# Patient Record
Sex: Female | Born: 1975 | Race: White | Hispanic: No | Marital: Married | Smoking: Never smoker
Health system: Southern US, Community
[De-identification: ages and names within clinical notes are randomized; demographics above are authoritative.]

## PROBLEM LIST (undated history)

## (undated) DIAGNOSIS — Z9049 Acquired absence of other specified parts of digestive tract: Secondary | ICD-10-CM

## (undated) DIAGNOSIS — N979 Female infertility, unspecified: Secondary | ICD-10-CM

## (undated) DIAGNOSIS — R51 Headache: Secondary | ICD-10-CM

## (undated) DIAGNOSIS — O09529 Supervision of elderly multigravida, unspecified trimester: Secondary | ICD-10-CM

## (undated) DIAGNOSIS — M549 Dorsalgia, unspecified: Secondary | ICD-10-CM

## (undated) DIAGNOSIS — Z8632 Personal history of gestational diabetes: Secondary | ICD-10-CM

## (undated) DIAGNOSIS — T7840XA Allergy, unspecified, initial encounter: Secondary | ICD-10-CM

## (undated) DIAGNOSIS — E739 Lactose intolerance, unspecified: Secondary | ICD-10-CM

## (undated) DIAGNOSIS — E559 Vitamin D deficiency, unspecified: Secondary | ICD-10-CM

## (undated) DIAGNOSIS — N83209 Unspecified ovarian cyst, unspecified side: Secondary | ICD-10-CM

## (undated) DIAGNOSIS — E7800 Pure hypercholesterolemia, unspecified: Secondary | ICD-10-CM

## (undated) DIAGNOSIS — D649 Anemia, unspecified: Secondary | ICD-10-CM

## (undated) DIAGNOSIS — O24419 Gestational diabetes mellitus in pregnancy, unspecified control: Secondary | ICD-10-CM

## (undated) HISTORY — DX: Anemia, unspecified: D64.9

## (undated) HISTORY — DX: Gestational diabetes mellitus in pregnancy, unspecified control: O24.419

## (undated) HISTORY — DX: Supervision of elderly multigravida, unspecified trimester: O09.529

## (undated) HISTORY — DX: Lactose intolerance, unspecified: E73.9

## (undated) HISTORY — DX: Unspecified ovarian cyst, unspecified side: N83.209

## (undated) HISTORY — DX: Acquired absence of other specified parts of digestive tract: Z90.49

## (undated) HISTORY — DX: Pure hypercholesterolemia, unspecified: E78.00

## (undated) HISTORY — DX: Female infertility, unspecified: N97.9

## (undated) HISTORY — DX: Allergy, unspecified, initial encounter: T78.40XA

## (undated) HISTORY — DX: Dorsalgia, unspecified: M54.9

## (undated) HISTORY — PX: CHOLECYSTECTOMY: SHX55

## (undated) HISTORY — DX: Personal history of gestational diabetes: Z86.32

## (undated) HISTORY — PX: TONSILLECTOMY: SUR1361

## (undated) HISTORY — DX: Vitamin D deficiency, unspecified: E55.9

## (undated) HISTORY — DX: Headache: R51

---

## 2012-10-11 LAB — OB RESULTS CONSOLE GBS: GBS: POSITIVE

## 2013-03-30 LAB — OB RESULTS CONSOLE ABO/RH: RH Type: POSITIVE

## 2013-03-30 LAB — OB RESULTS CONSOLE GC/CHLAMYDIA
CHLAMYDIA, DNA PROBE: NEGATIVE
GC PROBE AMP, GENITAL: NEGATIVE

## 2013-03-30 LAB — OB RESULTS CONSOLE RPR: RPR: NONREACTIVE

## 2013-03-30 LAB — OB RESULTS CONSOLE HEPATITIS B SURFACE ANTIGEN: HEP B S AG: NEGATIVE

## 2013-03-30 LAB — OB RESULTS CONSOLE RUBELLA ANTIBODY, IGM: RUBELLA: IMMUNE

## 2013-03-30 LAB — OB RESULTS CONSOLE ANTIBODY SCREEN: Antibody Screen: NEGATIVE

## 2013-03-30 LAB — OB RESULTS CONSOLE HIV ANTIBODY (ROUTINE TESTING): HIV: NONREACTIVE

## 2013-04-23 ENCOUNTER — Inpatient Hospital Stay (HOSPITAL_COMMUNITY): Admission: AD | Admit: 2013-04-23 | Payer: Self-pay | Source: Ambulatory Visit | Admitting: Obstetrics and Gynecology

## 2013-08-17 ENCOUNTER — Encounter: Payer: BC Managed Care – PPO | Attending: Obstetrics and Gynecology

## 2013-08-17 VITALS — Ht 66.75 in | Wt 213.9 lb

## 2013-08-17 DIAGNOSIS — Z713 Dietary counseling and surveillance: Secondary | ICD-10-CM | POA: Insufficient documentation

## 2013-08-17 DIAGNOSIS — O99810 Abnormal glucose complicating pregnancy: Secondary | ICD-10-CM | POA: Insufficient documentation

## 2013-08-17 NOTE — Progress Notes (Signed)
  Patient was seen on 08/17/13 for Gestational Diabetes self-management class at the [facility]. The following learning objectives were met by the patient during this course:   States the definition of Gestational Diabetes  States why dietary management is important in controlling blood glucose  Describes the effects of carbohydrates on blood glucose levels  Demonstrates ability to create a balanced meal plan  Demonstrates carbohydrate counting   States when to check blood glucose levels  Demonstrates proper blood glucose monitoring techniques  States the effect of stress and exercise on blood glucose levels  States the importance of limiting caffeine and abstaining from alcohol and smoking  Plan:  Aim for 2 Carb Choices per meal (30 grams) +/- 1 either way for breakfast Aim for 3 Carb Choices per meal (45 grams) +/- 1 either way from lunch and dinner Aim for 1-2 Carbs per snack Begin reading food labels for Total Carbohydrate and sugar grams of foods Consider  increasing your activity level by walking daily as tolerated Begin checking BG before breakfast and 1-2 hours after first bit of breakfast, lunch and dinner after  as directed by MD  Take medication  as directed by MD  Blood glucose monitor given: Accu Chek Nano BG Monitoring Kit Lot # [id number] Exp: 08/25/14 Blood glucose reading: 102  Patient instructed to monitor glucose levels: FBS: 60 - <90 1 hour: <140 2 hour: <120  Patient received the following handouts:  Nutrition Diabetes and Pregnancy  Carbohydrate Counting List  Meal Planning worksheet  Patient will be seen for follow-up as needed.

## 2013-10-11 LAB — OB RESULTS CONSOLE GBS: GBS: POSITIVE

## 2013-10-29 ENCOUNTER — Encounter (HOSPITAL_COMMUNITY): Payer: Self-pay | Admitting: *Deleted

## 2013-10-29 ENCOUNTER — Telehealth (HOSPITAL_COMMUNITY): Payer: Self-pay | Admitting: *Deleted

## 2013-10-29 NOTE — Telephone Encounter (Signed)
Preadmission screen  

## 2013-11-02 ENCOUNTER — Inpatient Hospital Stay (HOSPITAL_COMMUNITY): Payer: BC Managed Care – PPO | Admitting: Anesthesiology

## 2013-11-02 ENCOUNTER — Encounter (HOSPITAL_COMMUNITY): Payer: BC Managed Care – PPO | Admitting: Anesthesiology

## 2013-11-02 ENCOUNTER — Encounter (HOSPITAL_COMMUNITY)
Admission: AD | Disposition: A | Discharge status: Home / Self Care | Payer: Self-pay | Source: Ambulatory Visit | Attending: Obstetrics and Gynecology

## 2013-11-02 ENCOUNTER — Encounter (HOSPITAL_COMMUNITY): Payer: Self-pay | Admitting: *Deleted

## 2013-11-02 ENCOUNTER — Inpatient Hospital Stay (HOSPITAL_COMMUNITY)
Admission: AD | Admit: 2013-11-02 | Discharge: 2013-11-05 | DRG: 766 | Disposition: A | Discharge status: Home / Self Care | Payer: BC Managed Care – PPO | Source: Ambulatory Visit | Attending: Obstetrics and Gynecology | Admitting: Obstetrics and Gynecology

## 2013-11-02 DIAGNOSIS — O99892 Other specified diseases and conditions complicating childbirth: Secondary | ICD-10-CM | POA: Diagnosis present

## 2013-11-02 DIAGNOSIS — E669 Obesity, unspecified: Secondary | ICD-10-CM | POA: Diagnosis present

## 2013-11-02 DIAGNOSIS — O09529 Supervision of elderly multigravida, unspecified trimester: Secondary | ICD-10-CM | POA: Diagnosis present

## 2013-11-02 DIAGNOSIS — O4100X0 Oligohydramnios, unspecified trimester, not applicable or unspecified: Secondary | ICD-10-CM | POA: Diagnosis present

## 2013-11-02 DIAGNOSIS — D252 Subserosal leiomyoma of uterus: Secondary | ICD-10-CM | POA: Diagnosis present

## 2013-11-02 DIAGNOSIS — Z22330 Carrier of Group B streptococcus: Secondary | ICD-10-CM

## 2013-11-02 DIAGNOSIS — O99214 Obesity complicating childbirth: Secondary | ICD-10-CM

## 2013-11-02 DIAGNOSIS — O34599 Maternal care for other abnormalities of gravid uterus, unspecified trimester: Secondary | ICD-10-CM | POA: Diagnosis present

## 2013-11-02 DIAGNOSIS — O99814 Abnormal glucose complicating childbirth: Secondary | ICD-10-CM | POA: Diagnosis present

## 2013-11-02 DIAGNOSIS — Z98891 History of uterine scar from previous surgery: Secondary | ICD-10-CM

## 2013-11-02 DIAGNOSIS — D4959 Neoplasm of unspecified behavior of other genitourinary organ: Secondary | ICD-10-CM | POA: Diagnosis present

## 2013-11-02 DIAGNOSIS — O3410 Maternal care for benign tumor of corpus uteri, unspecified trimester: Secondary | ICD-10-CM

## 2013-11-02 DIAGNOSIS — O9989 Other specified diseases and conditions complicating pregnancy, childbirth and the puerperium: Secondary | ICD-10-CM

## 2013-11-02 LAB — CBC
HCT: 38.2 % (ref 36.0–46.0)
Hemoglobin: 13 g/dL (ref 12.0–15.0)
MCH: 27.8 pg (ref 26.0–34.0)
MCHC: 34 g/dL (ref 30.0–36.0)
MCV: 81.8 fL (ref 78.0–100.0)
PLATELETS: 168 10*3/uL (ref 150–400)
RBC: 4.67 MIL/uL (ref 3.87–5.11)
RDW: 16.1 % — ABNORMAL HIGH (ref 11.5–15.5)
WBC: 7.8 10*3/uL (ref 4.0–10.5)

## 2013-11-02 LAB — GLUCOSE, CAPILLARY
Glucose-Capillary: 83 mg/dL (ref 70–99)
Glucose-Capillary: 83 mg/dL (ref 70–99)

## 2013-11-02 LAB — RPR: RPR Ser Ql: NONREACTIVE

## 2013-11-02 SURGERY — Surgical Case
Anesthesia: Spinal | Site: Abdomen

## 2013-11-02 MED ORDER — FENTANYL CITRATE 0.05 MG/ML IJ SOLN
INTRAMUSCULAR | Status: DC | PRN
  Administered 2013-11-02: 25 ug via INTRATHECAL

## 2013-11-02 MED ORDER — LACTATED RINGERS IV SOLN: INTRAVENOUS | Status: DC

## 2013-11-02 MED ORDER — IBUPROFEN 600 MG PO TABS: 600.0000 mg | ORAL_TABLET | Freq: Four times a day (QID) | ORAL | Status: DC | PRN

## 2013-11-02 MED ORDER — ONDANSETRON HCL 4 MG/2ML IJ SOLN
INTRAMUSCULAR | Status: DC | PRN
  Administered 2013-11-02: 4 mg via INTRAVENOUS

## 2013-11-02 MED ORDER — FLEET ENEMA 7-19 GM/118ML RE ENEM: 1.0000 | ENEMA | RECTAL | Status: DC | PRN

## 2013-11-02 MED ORDER — MEPERIDINE HCL 25 MG/ML IJ SOLN: 6.2500 mg | INTRAMUSCULAR | Status: DC | PRN

## 2013-11-02 MED ORDER — OXYTOCIN 40 UNITS IN LACTATED RINGERS INFUSION - SIMPLE MED: 62.5000 mL/h | INTRAVENOUS | Status: DC

## 2013-11-02 MED ORDER — CEFAZOLIN SODIUM-DEXTROSE 2-3 GM-% IV SOLR
2.0000 g | Freq: Once | INTRAVENOUS | Status: AC
  Administered 2013-11-02: 2 g via INTRAVENOUS
  Filled 2013-11-02: qty 50

## 2013-11-02 MED ORDER — KETOROLAC TROMETHAMINE 30 MG/ML IJ SOLN
30.0000 mg | Freq: Four times a day (QID) | INTRAMUSCULAR | Status: AC | PRN
  Administered 2013-11-02: 30 mg via INTRAVENOUS

## 2013-11-02 MED ORDER — LIDOCAINE HCL (PF) 1 % IJ SOLN: 30.0000 mL | INTRAMUSCULAR | Status: DC | PRN

## 2013-11-02 MED ORDER — PHENYLEPHRINE 8 MG IN D5W 100 ML (0.08MG/ML) PREMIX OPTIME
INJECTION | INTRAVENOUS | Status: DC | PRN
  Administered 2013-11-02: 60 ug/min via INTRAVENOUS

## 2013-11-02 MED ORDER — MISOPROSTOL 25 MCG QUARTER TABLET
25.0000 ug | ORAL_TABLET | ORAL | Status: DC
  Administered 2013-11-02: 25 ug via VAGINAL
  Filled 2013-11-02: qty 0.25
  Filled 2013-11-02 (×3): qty 1

## 2013-11-02 MED ORDER — MEPERIDINE HCL 25 MG/ML IJ SOLN
6.2500 mg | INTRAMUSCULAR | Status: DC | PRN
Start: 2013-11-02 — End: 2013-11-03

## 2013-11-02 MED ORDER — ONDANSETRON HCL 4 MG/2ML IJ SOLN
INTRAMUSCULAR | Status: AC
  Filled 2013-11-02: qty 2

## 2013-11-02 MED ORDER — BUPIVACAINE IN DEXTROSE 0.75-8.25 % IT SOLN
INTRATHECAL | Status: DC | PRN
  Administered 2013-11-02: 1.6 mL via INTRATHECAL

## 2013-11-02 MED ORDER — FENTANYL CITRATE 0.05 MG/ML IJ SOLN
INTRAMUSCULAR | Status: AC
  Filled 2013-11-02: qty 2

## 2013-11-02 MED ORDER — OXYTOCIN 10 UNIT/ML IJ SOLN
40.0000 [IU] | INTRAVENOUS | Status: DC | PRN
  Administered 2013-11-02: 40 [IU] via INTRAVENOUS

## 2013-11-02 MED ORDER — LACTATED RINGERS IV SOLN
INTRAVENOUS | Status: DC | PRN
  Administered 2013-11-02: 20:00:00 via INTRAVENOUS

## 2013-11-02 MED ORDER — DEXTROSE 5 % IV SOLN
2.5000 10*6.[IU] | INTRAVENOUS | Status: DC
  Filled 2013-11-02 (×4): qty 2.5

## 2013-11-02 MED ORDER — MORPHINE SULFATE (PF) 0.5 MG/ML IJ SOLN
INTRAMUSCULAR | Status: DC | PRN
  Administered 2013-11-02: .15 mg via INTRATHECAL

## 2013-11-02 MED ORDER — ACETAMINOPHEN 160 MG/5ML PO SOLN
325.0000 mg | ORAL | Status: DC | PRN
Start: 2013-11-02 — End: 2013-11-03

## 2013-11-02 MED ORDER — PROMETHAZINE HCL 25 MG/ML IJ SOLN: 6.2500 mg | INTRAMUSCULAR | Status: DC | PRN

## 2013-11-02 MED ORDER — LACTATED RINGERS IV SOLN
INTRAVENOUS | Status: DC | PRN
  Administered 2013-11-02 (×2): via INTRAVENOUS

## 2013-11-02 MED ORDER — ONDANSETRON HCL 4 MG/2ML IJ SOLN: 4.0000 mg | Freq: Four times a day (QID) | INTRAMUSCULAR | Status: DC | PRN

## 2013-11-02 MED ORDER — KETOROLAC TROMETHAMINE 30 MG/ML IJ SOLN
INTRAMUSCULAR | Status: AC
  Filled 2013-11-02: qty 1

## 2013-11-02 MED ORDER — OXYCODONE-ACETAMINOPHEN 5-325 MG PO TABS: 1.0000 | ORAL_TABLET | ORAL | Status: DC | PRN

## 2013-11-02 MED ORDER — KETOROLAC TROMETHAMINE 30 MG/ML IJ SOLN: 30.0000 mg | Freq: Four times a day (QID) | INTRAMUSCULAR | Status: AC | PRN

## 2013-11-02 MED ORDER — ACETAMINOPHEN 325 MG PO TABS: 325.0000 mg | ORAL_TABLET | ORAL | Status: DC | PRN

## 2013-11-02 MED ORDER — LACTATED RINGERS IV SOLN: 500.0000 mL | INTRAVENOUS | Status: DC | PRN

## 2013-11-02 MED ORDER — SCOPOLAMINE 1 MG/3DAYS TD PT72
MEDICATED_PATCH | TRANSDERMAL | Status: AC
  Filled 2013-11-02: qty 1

## 2013-11-02 MED ORDER — SCOPOLAMINE 1 MG/3DAYS TD PT72
1.0000 | MEDICATED_PATCH | Freq: Once | TRANSDERMAL | Status: DC
  Administered 2013-11-02: 1.5 mg via TRANSDERMAL

## 2013-11-02 MED ORDER — OXYTOCIN BOLUS FROM INFUSION: 500.0000 mL | INTRAVENOUS | Status: DC

## 2013-11-02 MED ORDER — PHENYLEPHRINE 8 MG IN D5W 100 ML (0.08MG/ML) PREMIX OPTIME
INJECTION | INTRAVENOUS | Status: AC
  Filled 2013-11-02: qty 100

## 2013-11-02 MED ORDER — PENICILLIN G POTASSIUM 5000000 UNITS IJ SOLR
5.0000 10*6.[IU] | Freq: Once | INTRAMUSCULAR | Status: DC
  Filled 2013-11-02: qty 5

## 2013-11-02 MED ORDER — FENTANYL CITRATE 0.05 MG/ML IJ SOLN: 25.0000 ug | INTRAMUSCULAR | Status: DC | PRN

## 2013-11-02 MED ORDER — MORPHINE SULFATE 0.5 MG/ML IJ SOLN
INTRAMUSCULAR | Status: AC
  Filled 2013-11-02: qty 10

## 2013-11-02 MED ORDER — OXYTOCIN 10 UNIT/ML IJ SOLN
INTRAMUSCULAR | Status: AC
  Filled 2013-11-02: qty 4

## 2013-11-02 MED ORDER — CITRIC ACID-SODIUM CITRATE 334-500 MG/5ML PO SOLN
30.0000 mL | ORAL | Status: DC | PRN
  Administered 2013-11-02: 30 mL via ORAL
  Filled 2013-11-02: qty 15

## 2013-11-02 MED ORDER — ACETAMINOPHEN 325 MG PO TABS: 650.0000 mg | ORAL_TABLET | ORAL | Status: DC | PRN

## 2013-11-02 SURGICAL SUPPLY — 32 items
CLAMP CORD UMBIL (MISCELLANEOUS) IMPLANT
CLOTH BEACON ORANGE TIMEOUT ST (SAFETY) ×2 IMPLANT
DERMABOND ADHESIVE PROPEN (GAUZE/BANDAGES/DRESSINGS) ×1
DERMABOND ADVANCED (GAUZE/BANDAGES/DRESSINGS) ×1
DERMABOND ADVANCED .7 DNX12 (GAUZE/BANDAGES/DRESSINGS) ×1 IMPLANT
DERMABOND ADVANCED .7 DNX6 (GAUZE/BANDAGES/DRESSINGS) ×1 IMPLANT
DRAPE LG THREE QUARTER DISP (DRAPES) IMPLANT
DRSG OPSITE POSTOP 4X10 (GAUZE/BANDAGES/DRESSINGS) ×2 IMPLANT
DURAPREP 26ML APPLICATOR (WOUND CARE) ×2 IMPLANT
ELECT REM PT RETURN 9FT ADLT (ELECTROSURGICAL) ×2
ELECTRODE REM PT RTRN 9FT ADLT (ELECTROSURGICAL) ×1 IMPLANT
EXTRACTOR VACUUM M CUP 4 TUBE (SUCTIONS) IMPLANT
GLOVE BIO SURGEON STRL SZ7 (GLOVE) ×2 IMPLANT
GOWN STRL REUS W/ TWL XL LVL3 (GOWN DISPOSABLE) ×1 IMPLANT
GOWN STRL REUS W/TWL LRG LVL3 (GOWN DISPOSABLE) ×2 IMPLANT
GOWN STRL REUS W/TWL XL LVL3 (GOWN DISPOSABLE) ×1
KIT ABG SYR 3ML LUER SLIP (SYRINGE) ×2 IMPLANT
NEEDLE HYPO 25X5/8 SAFETYGLIDE (NEEDLE) ×2 IMPLANT
NS IRRIG 1000ML POUR BTL (IV SOLUTION) ×2 IMPLANT
PACK C SECTION WH (CUSTOM PROCEDURE TRAY) ×2 IMPLANT
PAD OB MATERNITY 4.3X12.25 (PERSONAL CARE ITEMS) ×2 IMPLANT
STAPLER VISISTAT 35W (STAPLE) IMPLANT
SUT CHROMIC 0 CTX 36 (SUTURE) ×4 IMPLANT
SUT MON AB 4-0 PS1 27 (SUTURE) ×2 IMPLANT
SUT PDS AB 0 CT 36 (SUTURE) ×2 IMPLANT
SUT PLAIN 0 NONE (SUTURE) IMPLANT
SUT PLAIN 2 0 XLH (SUTURE) IMPLANT
SUT VIC AB 3-0 CT1 27 (SUTURE) ×1
SUT VIC AB 3-0 CT1 TAPERPNT 27 (SUTURE) ×1 IMPLANT
TOWEL OR 17X24 6PK STRL BLUE (TOWEL DISPOSABLE) ×2 IMPLANT
TRAY FOLEY CATH 14FR (SET/KITS/TRAYS/PACK) ×2 IMPLANT
WATER STERILE IRR 1000ML POUR (IV SOLUTION) IMPLANT

## 2013-11-02 NOTE — Progress Notes (Signed)
Dr. Radene Knee into see pt prior to surgery to provide informed consent.

## 2013-11-02 NOTE — Progress Notes (Signed)
Patient ID: Mary Rocha, female   DOB: [birth year], 38 y.o.   MRN: [id number] Noted on sonogram today efw 8'  15''.  Discussed risk of macrosomia along with gestational diabetes.  Discussed risk of shoulder dystocia and resutltant complications.   fraxture to humerus or collar bone.  Brachial plexus injury with paralysis of arm.  Even fetal death.  Answered all questions.  Offered primary cesarean section.  They will discuss it.

## 2013-11-02 NOTE — Op Note (Signed)
Patient name  Mary Rocha, Mary Rocha DICTATION#  [id number] CSN# [id number]  Darlyn Chamber, MD 11/02/2013 8:32 PM

## 2013-11-02 NOTE — Brief Op Note (Signed)
11/02/2013  8:32 PM  PATIENT:  Mary Rocha  38 y.o. female  PRE-OPERATIVE DIAGNOSIS:  Failure to Progress  POST-OPERATIVE DIAGNOSIS:  Failure to Progress  PROCEDURE:  Procedure(s): CESAREAN SECTION (N/A)  SURGEON:  Surgeon(s) and Role:    * Darlyn Chamber, MD - Primary  PHYSICIAN ASSISTANT:   ASSISTANTS: none   ANESTHESIA:   spinal  EBL:  Total I/O In: 2300 [I.V.:2300] Out: 700 [Urine:100; Blood:600]  BLOOD ADMINISTERED:none  DRAINS: Urinary Catheter (Foley)   LOCAL MEDICATIONS USED:  NONE  SPECIMEN:  No Specimen  DISPOSITION OF SPECIMEN:  N/A  COUNTS:  YES  TOURNIQUET:  * No tourniquets in log *  DICTATION: .Other Dictation: Dictation Number [id number]  PLAN OF CARE: Admit to inpatient   PATIENT DISPOSITION:  PACU - hemodynamically stable.   Delay start of Pharmacological VTE agent (>24hrs) due to surgical blood loss or risk of bleeding: yes

## 2013-11-02 NOTE — Transfer of Care (Signed)
Immediate Anesthesia Transfer of Care Note  Patient: Mary Rocha  Procedure(s) Performed: Procedure(s): CESAREAN SECTION (N/A)  Patient Location: PACU  Anesthesia Type:Spinal  Level of Consciousness: awake  Airway & Oxygen Therapy: Patient Spontanous Breathing  Post-op Assessment: Report given to PACU RN  Post vital signs: Reviewed and stable  Complications: No apparent anesthesia complications

## 2013-11-02 NOTE — Anesthesia Preprocedure Evaluation (Signed)
Anesthesia Evaluation  Patient identified by MRN, date of birth, ID band Patient awake    Reviewed: Allergy & Precautions, H&P , Patient's Chart, lab work & pertinent test results  Airway Mallampati: III TM Distance: >3 FB Neck ROM: full    Dental no notable dental hx.    Pulmonary  breath sounds clear to auscultation  Pulmonary exam normal       Cardiovascular Exercise Tolerance: Good Rhythm:regular Rate:Normal     Neuro/Psych  Headaches,    GI/Hepatic   Endo/Other  diabetesMorbid obesity  Renal/GU      Musculoskeletal   Abdominal   Peds  Hematology   Anesthesia Other Findings   Reproductive/Obstetrics                           Anesthesia Physical Anesthesia Plan  ASA: III  Anesthesia Plan: Spinal   Post-op Pain Management:    Induction:   Airway Management Planned:   Additional Equipment:   Intra-op Plan:   Post-operative Plan:   Informed Consent: I have reviewed the patients History and Physical, chart, labs and discussed the procedure including the risks, benefits and alternatives for the proposed anesthesia with the patient or authorized representative who has indicated his/her understanding and acceptance.     Plan Discussed with:   Anesthesia Plan Comments:         Anesthesia Quick Evaluation

## 2013-11-02 NOTE — H&P (Signed)
Mary Rocha is a 38 y.o. female presenting at 2 weeks for induction.  Seen in the office today for sonogram which revealed AFI < 3rd%tile.  EFW 8'15''.  PNC complicated by gestational diabetes diet controlled.  Positive GBS. Maternal Medical History:  Reason for admission: Induction for decreased aAFI  Contractions: Frequency: rare.   Perceived severity is mild.    Fetal activity: Perceived fetal activity is normal.    Prenatal complications: Oligohydramnios.  Gestational diabetes diet controlled.  IVF pregnancy.  posotove GBS  Prenatal Complications - Diabetes: gestational. Diabetes is managed by diet.      OB History   Grav Para Term Preterm Abortions TAB SAB Ect Mult Living   2 0   1  1        Past Medical History  Diagnosis Date  . Allergy   . History of cholecystectomy   . Newborn product of IVF pregnancy   . Gestational diabetes   . AMA (advanced maternal age) multigravida 57+   . IONGEXBM(841.3)    Past Surgical History  Procedure Laterality Date  . Tonsillectomy    . Cholecystectomy     Family History: family history includes Cancer in her maternal grandfather and paternal grandfather; Diabetes in her maternal aunt; Heart disease in her paternal grandfather; Thyroid disease in her maternal grandmother. Social History:  has no tobacco, alcohol, and drug history on file.   Prenatal Transfer Tool  Maternal Diabetes: Yes:  Diabetes Type:  Diet controlled Genetic Screening: Declined Maternal Ultrasounds/Referrals: Normal Fetal Ultrasounds or other Referrals:  None Maternal Substance Abuse:  No Significant Maternal Medications:  None Significant Maternal Lab Results:  Lab values include: Group B Strep positive Other Comments:  None  ROS  Dilation: Fingertip Effacement (%): 50 Station: -2 Exam by:: m wilkins rnc Blood pressure 142/83, pulse 77, height 5\' 6"  (1.676 m), weight 111.131 kg (245 lb). Maternal Exam:  Uterine Assessment: Contraction strength  is mild.  Contraction frequency is rare.   Abdomen: Patient reports no abdominal tenderness. Fundal height is c/w dates.   Estimated fetal weight is 8'  15''.   Fetal presentation: vertex  Introitus: Amniotic fluid character: not assessed.  Pelvis: adequate for delivery.   Cervix: Finger tip and 50 % vtx -2 per Dr Helane Rima  Fetal Exam Fetal State Assessment: Category I - tracings are normal.     Physical Exam  Prenatal labs: ABO, Rh: O/Positive/-- (08/05 0000) Antibody: Negative (08/05 0000) Rubella: Immune (08/05 0000) RPR: Nonreactive (08/05 0000)  HBsAg: Negative (08/05 0000)  HIV: Non-reactive (08/05 0000)  GBS: Positive (02/16 0000)   Assessment/Plan: Pregnancy at term with oligohydramnios Gestational diabetes Positive GBS Plan cytotec risks discussed including hyperstimulation.  Antibiotics with labor or ROM   Mary Rocha S 11/02/2013, 2:22 PM

## 2013-11-02 NOTE — Anesthesia Postprocedure Evaluation (Signed)
  Anesthesia Post-op Note  Patient: Mary Rocha  Procedure(s) Performed: Procedure(s): CESAREAN SECTION (N/A)  Patient Location: PACU  Anesthesia Type:Spinal  Level of Consciousness: awake, alert  and oriented  Airway and Oxygen Therapy: Patient Spontanous Breathing  Post-op Pain: none  Post-op Assessment: Post-op Vital signs reviewed, Patient's Cardiovascular Status Stable, Respiratory Function Stable, Patent Airway, No signs of Nausea or vomiting, Pain level controlled, No headache and No backache  Post-op Vital Signs: Reviewed and stable  Complications: No apparent anesthesia complications

## 2013-11-02 NOTE — Progress Notes (Signed)
Patient ID: Mary Rocha, female   DOB: 04-06-1976, 38 y.o.   MRN: [id number] Desires primary cesarean section.  Risk of cesarean section discussed.  These include:  Risk of infection;  Risk of hemorrhage that could require transfusions with the associated risk of aids or hepatitis;  Excessive bleeding could require hysterectomy;  Risk of injury to adjacent organs including bladder, bowel or ureters;  Risk of DVT's and possible pulmonary embolus.  Patient expresses a understanding of indications and risks.;

## 2013-11-02 NOTE — Anesthesia Procedure Notes (Signed)
Spinal  Patient location during procedure: OR Start time: 11/02/2013 7:44 PM Staffing Anesthesiologist: Markcus Lazenby A. Performed by: anesthesiologist  Preanesthetic Checklist Completed: patient identified, site marked, surgical consent, pre-op evaluation, timeout performed, IV checked, risks and benefits discussed and monitors and equipment checked Spinal Block Patient position: sitting Prep: site prepped and draped and DuraPrep Patient monitoring: heart rate, cardiac monitor, continuous pulse ox and blood pressure Approach: midline Location: L3-4 Injection technique: single-shot Needle Needle type: Sprotte  Needle gauge: 24 G Needle length: 9 cm Needle insertion depth: 6 cm Assessment Sensory level: T4 Additional Notes Patient tolerated procedure well. Adequate sensory level.

## 2013-11-03 ENCOUNTER — Encounter (HOSPITAL_COMMUNITY): Payer: Self-pay | Admitting: Obstetrics and Gynecology

## 2013-11-03 LAB — CBC
HEMATOCRIT: 34.8 % — AB (ref 36.0–46.0)
Hemoglobin: 11.7 g/dL — ABNORMAL LOW (ref 12.0–15.0)
MCH: 27.7 pg (ref 26.0–34.0)
MCHC: 33.6 g/dL (ref 30.0–36.0)
MCV: 82.5 fL (ref 78.0–100.0)
PLATELETS: 147 10*3/uL — AB (ref 150–400)
RBC: 4.22 MIL/uL (ref 3.87–5.11)
RDW: 16 % — ABNORMAL HIGH (ref 11.5–15.5)
WBC: 11.5 10*3/uL — ABNORMAL HIGH (ref 4.0–10.5)

## 2013-11-03 MED ORDER — DIPHENHYDRAMINE HCL 50 MG/ML IJ SOLN: 12.5000 mg | INTRAMUSCULAR | Status: DC | PRN

## 2013-11-03 MED ORDER — SIMETHICONE 80 MG PO CHEW: 80.0000 mg | CHEWABLE_TABLET | ORAL | Status: DC | PRN

## 2013-11-03 MED ORDER — TETANUS-DIPHTH-ACELL PERTUSSIS 5-2.5-18.5 LF-MCG/0.5 IM SUSP: 0.5000 mL | Freq: Once | INTRAMUSCULAR | Status: DC

## 2013-11-03 MED ORDER — DIPHENHYDRAMINE HCL 25 MG PO CAPS: 25.0000 mg | ORAL_CAPSULE | ORAL | Status: DC | PRN

## 2013-11-03 MED ORDER — NALBUPHINE HCL 10 MG/ML IJ SOLN: 5.0000 mg | INTRAMUSCULAR | Status: DC | PRN

## 2013-11-03 MED ORDER — LACTATED RINGERS IV SOLN
INTRAVENOUS | Status: DC
  Administered 2013-11-03: 05:00:00 via INTRAVENOUS

## 2013-11-03 MED ORDER — OXYTOCIN 40 UNITS IN LACTATED RINGERS INFUSION - SIMPLE MED: 62.5000 mL/h | INTRAVENOUS | Status: AC

## 2013-11-03 MED ORDER — BISACODYL 10 MG RE SUPP: 10.0000 mg | Freq: Every day | RECTAL | Status: DC | PRN

## 2013-11-03 MED ORDER — ONDANSETRON HCL 4 MG/2ML IJ SOLN: 4.0000 mg | Freq: Three times a day (TID) | INTRAMUSCULAR | Status: DC | PRN

## 2013-11-03 MED ORDER — NALOXONE HCL 0.4 MG/ML IJ SOLN: 0.4000 mg | INTRAMUSCULAR | Status: DC | PRN

## 2013-11-03 MED ORDER — NALOXONE HCL 1 MG/ML IJ SOLN
1.0000 ug/kg/h | INTRAVENOUS | Status: DC | PRN
  Filled 2013-11-03: qty 2

## 2013-11-03 MED ORDER — PRENATAL MULTIVITAMIN CH
1.0000 | ORAL_TABLET | Freq: Every day | ORAL | Status: DC
  Administered 2013-11-03 – 2013-11-04 (×2): 1 via ORAL
  Filled 2013-11-03 (×2): qty 1

## 2013-11-03 MED ORDER — ONDANSETRON HCL 4 MG/2ML IJ SOLN: 4.0000 mg | INTRAMUSCULAR | Status: DC | PRN

## 2013-11-03 MED ORDER — SODIUM CHLORIDE 0.9 % IV SOLN: 250.0000 mL | INTRAVENOUS | Status: DC

## 2013-11-03 MED ORDER — OXYCODONE-ACETAMINOPHEN 5-325 MG PO TABS: 1.0000 | ORAL_TABLET | ORAL | Status: DC | PRN

## 2013-11-03 MED ORDER — WITCH HAZEL-GLYCERIN EX PADS: 1.0000 "application " | MEDICATED_PAD | CUTANEOUS | Status: DC | PRN

## 2013-11-03 MED ORDER — DIBUCAINE 1 % RE OINT: 1.0000 "application " | TOPICAL_OINTMENT | RECTAL | Status: DC | PRN

## 2013-11-03 MED ORDER — SENNOSIDES-DOCUSATE SODIUM 8.6-50 MG PO TABS
2.0000 | ORAL_TABLET | ORAL | Status: DC
  Administered 2013-11-03: 2 via ORAL
  Filled 2013-11-03 (×2): qty 2

## 2013-11-03 MED ORDER — INFLUENZA VAC SPLIT QUAD 0.5 ML IM SUSP
0.5000 mL | INTRAMUSCULAR | Status: AC
  Administered 2013-11-04: 0.5 mL via INTRAMUSCULAR
  Filled 2013-11-03: qty 0.5

## 2013-11-03 MED ORDER — ZOLPIDEM TARTRATE 5 MG PO TABS: 5.0000 mg | ORAL_TABLET | Freq: Every evening | ORAL | Status: DC | PRN

## 2013-11-03 MED ORDER — METOCLOPRAMIDE HCL 5 MG/ML IJ SOLN: 10.0000 mg | Freq: Three times a day (TID) | INTRAMUSCULAR | Status: DC | PRN

## 2013-11-03 MED ORDER — SODIUM CHLORIDE 0.9 % IJ SOLN: 3.0000 mL | INTRAMUSCULAR | Status: DC | PRN

## 2013-11-03 MED ORDER — ONDANSETRON HCL 4 MG PO TABS: 4.0000 mg | ORAL_TABLET | ORAL | Status: DC | PRN

## 2013-11-03 MED ORDER — SODIUM CHLORIDE 0.9 % IJ SOLN: 3.0000 mL | Freq: Two times a day (BID) | INTRAMUSCULAR | Status: DC

## 2013-11-03 MED ORDER — SIMETHICONE 80 MG PO CHEW
80.0000 mg | CHEWABLE_TABLET | Freq: Three times a day (TID) | ORAL | Status: DC
  Administered 2013-11-03 – 2013-11-04 (×6): 80 mg via ORAL
  Filled 2013-11-03 (×6): qty 1

## 2013-11-03 MED ORDER — DIPHENHYDRAMINE HCL 25 MG PO CAPS: 25.0000 mg | ORAL_CAPSULE | Freq: Four times a day (QID) | ORAL | Status: DC | PRN

## 2013-11-03 MED ORDER — MENTHOL 3 MG MT LOZG: 1.0000 | LOZENGE | OROMUCOSAL | Status: DC | PRN

## 2013-11-03 MED ORDER — SODIUM CHLORIDE 0.9 % IJ SOLN
3.0000 mL | INTRAMUSCULAR | Status: DC | PRN
  Administered 2013-11-03: 3 mL via INTRAVENOUS

## 2013-11-03 MED ORDER — FLEET ENEMA 7-19 GM/118ML RE ENEM
1.0000 | ENEMA | Freq: Every day | RECTAL | Status: DC | PRN
Start: 2013-11-03 — End: 2013-11-05

## 2013-11-03 MED ORDER — IBUPROFEN 600 MG PO TABS
600.0000 mg | ORAL_TABLET | Freq: Four times a day (QID) | ORAL | Status: DC
  Administered 2013-11-03 – 2013-11-05 (×9): 600 mg via ORAL
  Filled 2013-11-03 (×9): qty 1

## 2013-11-03 MED ORDER — LANOLIN HYDROUS EX OINT: 1.0000 "application " | TOPICAL_OINTMENT | CUTANEOUS | Status: DC | PRN

## 2013-11-03 MED ORDER — DIPHENHYDRAMINE HCL 50 MG/ML IJ SOLN: 25.0000 mg | INTRAMUSCULAR | Status: DC | PRN

## 2013-11-03 MED ORDER — SIMETHICONE 80 MG PO CHEW
80.0000 mg | CHEWABLE_TABLET | ORAL | Status: DC
  Administered 2013-11-03 – 2013-11-05 (×2): 80 mg via ORAL
  Filled 2013-11-03 (×2): qty 1

## 2013-11-03 NOTE — Op Note (Signed)
NAMEZYANYA, GLAZA           ACCOUNT NO.:  [id number]  MEDICAL RECORD NO.:  [id number]  LOCATION:  9116                          FACILITY:  [facility]  PHYSICIAN:  Darlyn Chamber, M.D.   DATE OF BIRTH:  November 17, 1975  DATE OF PROCEDURE:  11/02/2013 DATE OF DISCHARGE:                              OPERATIVE REPORT   PREOPERATIVE DIAGNOSES:  Intrauterine pregnancy at 40 weeks with oligohydramnios.  Large estimated fetal weight.  Gestational diabetes. Decision was to proceed with primary cesarean section.  POSTOPERATIVE DIAGNOSES:  Intrauterine pregnancy at 40 weeks with oligohydramnios.  Large estimated fetal weight.  Gestational diabetes. Decision was to proceed with primary cesarean section.  OPERATIVE PROCEDURE:  Low transverse cesarean section.  SURGEON:  Darlyn Chamber, M.D.  ANESTHESIA:  Spinal.  ESTIMATED BLOOD LOSS:  350 mL.  PACKS:  None.  DRAINS:  Included urethral Foley.  INTRAOPERATIVE BLOOD PLACED:  None.  COMPLICATIONS:  None.  INDICATION:  The patient is a 38 year old, primigravida female, who is sent over to the labor and delivery after an ultrasound in the office revealed oligohydramnios at 40 weeks.  She was begun on Cytotec.  It was discovered on the ultrasound that the baby did weigh 8 pounds 15 ounces and the patient was suffering from gestational diabetes.  We discussed the options of vaginal delivery versus cesarean section.  The risk of vaginal delivery were explained in terms of shoulder dystocia and associated complications.  The patient and her husband decided on cesarean section.  The risks were explained including the risk of infection.  Risk of hemorrhage that could require transfusion with the risk of hepatitis, excessive bleeding could require hysterectomy.  Risk of injury to adjacent organs including bladder, bowel, or ureters that could require further exploratory surgery.  Risk of deep venous thrombosis and pulmonary embolus.  The patient  expressed to understand of indications and options.  DESCRIPTION OF PROCEDURE:  The patient was taken to the OR and placed in supine position.  After satisfactory level of spinal anesthesia obtained, the patient was prepped and draped as a sterile field.  A low- transverse skin incision made with a knife, carried through subcutaneous tissue.  Anterior rectus fascia was entered sharply, incision of fascia laterally.  The fascia was taken off the muscle superiorly and inferiorly.  Rectus muscles were separated in midline.  Anterior perineum was entered sharply and incision of perineum extended both superiorly and inferiorly.  A low-transverse bladder flap was developed. Low transverse uterine incision begun with a knife extended laterally using manual traction.  Amniotic fluid was clear.  The infant was in the vertex presentation, delivered by elevation and fundal pressure.  The infant was a viable female, Apgars were 9/9.  Placenta was delivered manually.  Uterus was exteriorized for closure.  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  Tubes and ovaries were unremarkable.  Uterus was closed in a running locking suture of 0 chromic using a 2-layer closure technique.  Areas of continued bleeding were brought under control with figure-of-eights of 0 chromic.  Uterus was returned to the abdominal cavity.  We irrigated the pelvis.  We had good hemostasis with clear urine output.  Muscles and  peritoneum closed with running suture of 3-0 Vicryl.  Fascia was closed with running suture of 0 PDS.  Subcutaneous fat was closed in running suture of 2-0 chromic.  Skin was closed in a running subcuticular of 4-0 Vicryl and Dermabond.  Sponge, instrument, and needle count was correct by circulating nurse x3.  Foley catheter remained clear at the time of closure.  The patient did tolerate procedure well and return to recovery room in good condition.     Darlyn Chamber,  M.D.     JSM/MEDQ  D:  11/02/2013  T:  11/03/2013  Job:  [id number]

## 2013-11-03 NOTE — Progress Notes (Signed)
Subjective: Postpartum Day 1: Cesarean Delivery Patient reports tolerating PO.    Objective: Vital signs in last 24 hours: Temp:  [97.6 F (36.4 C)-98.9 F (37.2 C)] 98.2 F (36.8 C) (03/11 0650) Pulse Rate:  [61-91] 69 (03/11 0650) Resp:  [16-28] 20 (03/11 0650) BP: (114-146)/(54-92) 128/83 mmHg (03/11 0650) SpO2:  [95 %-98 %] 95 % (03/11 0650) Weight:  [245 lb (111.131 kg)] 245 lb (111.131 kg) (03/10 1155)  Physical Exam:  General: alert and cooperative Lochia: appropriate Uterine Fundus: firm Incision: honeycomb dressing CDI DVT Evaluation: No evidence of DVT seen on physical exam. Negative Homan's sign. No cords or calf tenderness. Calf/Ankle edema is present.   Recent Labs  11/02/13 1150 11/03/13 0550  HGB 13.0 11.7*  HCT 38.2 34.8*    Assessment/Plan: Status post Cesarean section. Doing well postoperatively.  Continue current care.  Wendy Mikles G 11/03/2013, 8:19 AM

## 2013-11-03 NOTE — Anesthesia Postprocedure Evaluation (Signed)
  Anesthesia Post-op Note  Patient: Mary Rocha  Procedure(s) Performed: Procedure(s): CESAREAN SECTION (N/A)  Patient Location: Mother/Baby  Anesthesia Type:Spinal  Level of Consciousness: awake, alert , oriented and patient cooperative  Airway and Oxygen Therapy: Patient Spontanous Breathing  Post-op Pain: mild  Post-op Assessment: Patient's Cardiovascular Status Stable, Respiratory Function Stable, No headache, No backache, No residual numbness and No residual motor weakness  Post-op Vital Signs: stable  Complications: No apparent anesthesia complications

## 2013-11-03 NOTE — Lactation Note (Signed)
This note was copied from the chart of Mary Rocha. Lactation Consultation Note  Patient Name: Mary Rocha [id number] Date: 11/03/2013 Reason for consult: Initial assessment Mom reports baby is latching well, denies tenderness. At this visit, baby was not opening her mouth wide and when latched was at the base of the nipple. Assisted Mom with obtaining more depth with latch, demonstrated breast compression. Reviewed with Mom how to perform jaw massage and suck training to help with latch. BF basics reviewed with Mom. Mom is AMA and this is IVF pregnancy. Mom reports breast changes early pregnancy, increase tenderness, increase size.  LC notes some wide spacing between breasts 2-3 FB. Glistening of colostrum with hand expression. Encouraged to BF with feeding ques but at least every 3 hours. Cluster feeding reviewed. Lactation brochure left for review. Advised of OP services and support group. Encouraged to call for assist as needed with latch.   Maternal Data Formula Feeding for Exclusion: No Infant to breast within first hour of birth: No Breastfeeding delayed due to:: Maternal status Has patient been taught Hand Expression?: Yes Does the patient have breastfeeding experience prior to this delivery?: No  Feeding Feeding Type: Breast Fed Length of feed: 13 min  LATCH Score/Interventions Latch: Repeated attempts needed to sustain latch, nipple held in mouth throughout feeding, stimulation needed to elicit sucking reflex. Intervention(s): Adjust position;Assist with latch;Breast massage;Breast compression  Audible Swallowing: None  Type of Nipple: Everted at rest and after stimulation  Comfort (Breast/Nipple): Soft / non-tender     Hold (Positioning): Assistance needed to correctly position infant at breast and maintain latch. Intervention(s): Breastfeeding basics reviewed;Support Pillows;Position options;Skin to skin  LATCH Score: 6  Lactation Tools  Discussed/Used     Consult Status Consult Status: Follow-up Date: 11/04/13 Follow-up type: In-patient    Katrine Coho 11/03/2013, 2:59 PM

## 2013-11-03 NOTE — Addendum Note (Signed)
Addendum created 11/03/13 0940 by Georgeanne Nim, CRNA   Modules edited: Notes Section   Notes Section:  File: [id number]

## 2013-11-04 LAB — BIRTH TISSUE RECOVERY COLLECTION (PLACENTA DONATION)

## 2013-11-04 MED ORDER — AMOXICILLIN-POT CLAVULANATE 500-125 MG PO TABS
1.0000 | ORAL_TABLET | Freq: Two times a day (BID) | ORAL | Status: DC
  Administered 2013-11-04 – 2013-11-05 (×3): 500 mg via ORAL
  Filled 2013-11-04 (×3): qty 1

## 2013-11-04 NOTE — Progress Notes (Signed)
Tolerating augmentin  VSS afeb  Abd  Erythema unchanged  Continue treatment

## 2013-11-04 NOTE — Progress Notes (Signed)
Patient reports CBG with home device was 64 prior to breakfast.

## 2013-11-04 NOTE — Progress Notes (Signed)
Subjective: Postpartum Day 2: Cesarean Delivery Patient reports tolerating PO, + flatus and no problems voiding.    Objective: Vital signs in last 24 hours: Temp:  [97.3 F (36.3 C)-98.5 F (36.9 C)] 98.1 F (36.7 C) (03/12 0647) Pulse Rate:  [67-85] 85 (03/12 0647) Resp:  [18-20] 18 (03/12 0647) BP: (119-133)/(80-89) 133/80 mmHg (03/12 0647) SpO2:  [96 %-98 %] 97 % (03/11 2100)  Physical Exam:  General: alert, cooperative and no distress Lochia: appropriate Uterine Fundus: firm Incision: diffuse erythema and warmth along lower pannus superior to incision. No drainage from incicion. DVT Evaluation: No evidence of DVT seen on physical exam.   Recent Labs  11/02/13 1150 11/03/13 0550  HGB 13.0 11.7*  HCT 38.2 34.8*    Assessment/Plan: Status post Cesarean section. Postoperative course complicated by possible wound infection  Begin Augmentin Observe and reevaluate tomorrow Patient will check her fasting capillary glucose this am (history of GDM).  Mary Rocha,Sora Olivo E 11/04/2013, 8:19 AM

## 2013-11-04 NOTE — Lactation Note (Addendum)
This note was copied from the chart of Mary Rocha. Lactation Consultation Note  Patient Name: Mary Rocha [id number] Date: 11/04/2013 Reason for consult: Follow-up assessment;Breast/nipple pain of (L) nipple, as reported by mom.  LC offered comfort gelpads but mom declines; wants to apply ebm to nipples and will continue cue feedings.  Mom states that baby typically nurses on one breast per feeding and baby has had multiple feedings of 10-45 minutes each with most recent LATCH score=7 due to soreness of the (L) nipple.  Baby's output exceeds minimum for this day of life.  LC reviewed nipple care and suggested FOB assist by trying chin tug technique as baby latches to see if deeper latch achieved.  Mom to try at next feeding and call for [facility] to return as needed tonight.   Maternal Data    Feeding    LATCH Score/Interventions             Interventions  (Cracked/bleeding/bruising/blister): Expressed breast milk to nipple (LC offered comfort gelpads but mom declines; wants to apply ebm to nipples)        Lactation Tools Discussed/Used   Cue feedings Nipple care with expressed milk (mom declined comfort gelpads)  Consult Status Consult Status: Follow-up Date: 11/05/13 Follow-up type: In-patient    Mary Rocha [facility] 11/04/2013, 8:04 PM

## 2013-11-05 MED ORDER — AMOXICILLIN-POT CLAVULANATE 500-125 MG PO TABS: 1.0000 | ORAL_TABLET | Freq: Two times a day (BID) | ORAL | Status: DC

## 2013-11-05 MED ORDER — IBUPROFEN 600 MG PO TABS: 600.0000 mg | ORAL_TABLET | Freq: Four times a day (QID) | ORAL | Status: DC

## 2013-11-05 NOTE — Discharge Summary (Signed)
Obstetric Discharge Summary Reason for Admission: induction of labor Prenatal Procedures: ultrasound Intrapartum Procedures: cesarean: low cervical, transverse Postpartum Procedures: antibiotics Complications-Operative and Postpartum: erythema noted in the skin around the incision Hemoglobin  Date Value Ref Range Status  11/03/2013 11.7* 12.0 - 15.0 g/dL Final     HCT  Date Value Ref Range Status  11/03/2013 34.8* 36.0 - 46.0 % Final    Physical Exam:  General: alert and cooperative Lochia: appropriate Uterine Fundus: firm Incision: healing well, erythema noted superior to incision, slight warmth also noted, improving DVT Evaluation: No evidence of DVT seen on physical exam. Negative Homan's sign. No cords or calf tenderness.  Discharge Diagnoses: Term Pregnancy-delivered  Discharge Information: Date: 11/05/2013 Activity: pelvic rest Diet: routine Medications: PNV, Ibuprofen and augmentin Condition: improved Instructions: refer to practice specific booklet Discharge to: home   Newborn Data: Live born female  Birth Weight: 9 lb 0.1 oz (4085 g) APGAR: 9, 9  Home with mother.  CURTIS,CAROL G 11/05/2013, 8:10 AM

## 2013-11-05 NOTE — Lactation Note (Signed)
This note was copied from the chart of Mary Rocha. Lactation Consultation Note Follow up consult:  Baby Mary 93 hours old.  Mother breastfeeding in cradle hold upon entering the room.  Mother has bruised cracked nipples.  Repositioned mother in football hold with a deeper latch and reviewed basics.  Reviewed softening breast prior to latching, engorgement care, breast care, hand pump use, pacifiers and lactation support services.   Patient Name: Mary Leata Dominy [id number] Date: 11/05/2013 Reason for consult: Follow-up assessment;Infant weight loss   Maternal Data    Feeding Feeding Type: Breast Fed Length of feed: 15 min  LATCH Score/Interventions Latch: Grasps breast easily, tongue down, lips flanged, rhythmical sucking. Intervention(s): Adjust position;Assist with latch;Breast massage  Audible Swallowing: Spontaneous and intermittent Intervention(s): Hand expression  Type of Nipple: Everted at rest and after stimulation  Comfort (Breast/Nipple): Engorged, cracked, bleeding, large blisters, severe discomfort Problem noted: Cracked, bleeding, blisters, bruises Intervention(s): Hand pump  Problem noted: Cracked, bleeding, blisters, bruises Interventions  (Cracked/bleeding/bruising/blister): Hand pump;Expressed breast milk to nipple Interventions (Mild/moderate discomfort): Comfort gels  Hold (Positioning): Assistance needed to correctly position infant at breast and maintain latch. Intervention(s): Breastfeeding basics reviewed;Position options  LATCH Score: 7  Lactation Tools Discussed/Used     Consult Status Consult Status: Complete    Carlye Grippe 11/05/2013, 9:41 AM

## 2013-11-07 ENCOUNTER — Inpatient Hospital Stay (HOSPITAL_COMMUNITY): Admission: RE | Admit: 2013-11-07 | Payer: BC Managed Care – PPO | Source: Ambulatory Visit

## 2014-06-27 ENCOUNTER — Encounter (HOSPITAL_COMMUNITY): Payer: Self-pay | Admitting: Obstetrics and Gynecology

## 2017-04-03 ENCOUNTER — Other Ambulatory Visit: Payer: Self-pay | Admitting: Obstetrics and Gynecology

## 2017-04-03 DIAGNOSIS — R928 Other abnormal and inconclusive findings on diagnostic imaging of breast: Secondary | ICD-10-CM

## 2017-04-10 ENCOUNTER — Ambulatory Visit: Payer: BC Managed Care – PPO

## 2017-04-10 ENCOUNTER — Ambulatory Visit
Admission: RE | Admit: 2017-04-10 | Discharge: 2017-04-10 | Disposition: A | Discharge status: Home / Self Care | Payer: BC Managed Care – PPO | Source: Ambulatory Visit | Attending: Obstetrics and Gynecology | Admitting: Obstetrics and Gynecology

## 2017-04-10 DIAGNOSIS — R928 Other abnormal and inconclusive findings on diagnostic imaging of breast: Secondary | ICD-10-CM

## 2018-04-20 LAB — HM MAMMOGRAPHY

## 2018-04-20 LAB — HM PAP SMEAR: HM Pap smear: NEGATIVE

## 2018-08-04 ENCOUNTER — Ambulatory Visit (INDEPENDENT_AMBULATORY_CARE_PROVIDER_SITE_OTHER): Payer: 59 | Admitting: Osteopathic Medicine

## 2018-08-04 ENCOUNTER — Encounter: Payer: Self-pay | Admitting: Osteopathic Medicine

## 2018-08-04 VITALS — BP 111/59 | HR 65 | Temp 98.5°F | Ht 67.0 in | Wt 219.9 lb

## 2018-08-04 DIAGNOSIS — Z8632 Personal history of gestational diabetes: Secondary | ICD-10-CM | POA: Diagnosis not present

## 2018-08-04 DIAGNOSIS — R631 Polydipsia: Secondary | ICD-10-CM

## 2018-08-04 DIAGNOSIS — Z8349 Family history of other endocrine, nutritional and metabolic diseases: Secondary | ICD-10-CM | POA: Insufficient documentation

## 2018-08-04 DIAGNOSIS — Z0000 Encounter for general adult medical examination without abnormal findings: Secondary | ICD-10-CM

## 2018-08-04 DIAGNOSIS — N926 Irregular menstruation, unspecified: Secondary | ICD-10-CM | POA: Insufficient documentation

## 2018-08-04 DIAGNOSIS — D229 Melanocytic nevi, unspecified: Secondary | ICD-10-CM

## 2018-08-04 DIAGNOSIS — R232 Flushing: Secondary | ICD-10-CM

## 2018-08-04 DIAGNOSIS — Z23 Encounter for immunization: Secondary | ICD-10-CM | POA: Diagnosis not present

## 2018-08-04 HISTORY — DX: Personal history of gestational diabetes: Z86.32

## 2018-08-04 NOTE — Progress Notes (Signed)
HPI: Mary Rocha is a 42 y.o. female who  has a past medical history of Allergy, AMA (advanced maternal age) multigravida 35+, Gestational diabetes, Headache(784.0), History of cholecystectomy, and Newborn product of IVF pregnancy.  she presents to [facility] today, 08/04/18,  for chief complaint of: New to establish care - requests physical Check-up - hx GDM  Endocrine: Night sweats on occsaion Noticing increased thirst more recently Heat intolerance seems worse with sugary foods Abnormal periods and family history of early menopause.   Resp: Seasonal allergies are controlled.  Never smoker  OBGYN: Monogamous w/ husband. [id number] (2 miscarriages). Able to conceive w/ donor egg / IVF and daughter is now 70 yo. Last Pap 04/2018, last mammo 04/2018  Skin; Spot on her back she'd like looked at. She checks it regularly in mirror and w help from her husband, been stable a few years but right o nthe bra line and it's irritating     Flu vaccine last year Tdap 2015  S/p c-section and cholecystectomy   Pancreatic cancer in several family members, pt reports negative testing at [facility] for genetics mutation     Past medical, surgical, social and family history reviewed:  Patient Active Problem List   Diagnosis Date Noted  . S/P cesarean section 11/02/2013    Class: Status post    Past Surgical History:  Procedure Laterality Date  . CESAREAN SECTION N/A 11/02/2013   Procedure: CESAREAN SECTION;  Surgeon: Darlyn Chamber, MD;  Location: [facility];  Service: Obstetrics;  Laterality: N/A;  . CHOLECYSTECTOMY    . TONSILLECTOMY      Social History   Tobacco Use  . Smoking status: Never Smoker  . Smokeless tobacco: Never Used  Substance Use Topics  . Alcohol use: Yes    Comment: 1 - 2 drinks / month    Family History  Problem Relation Age of Onset  . Diabetes Maternal Aunt   . Thyroid disease Maternal Grandmother   . Pancreatic  cancer Maternal Grandmother   . Cancer Maternal Grandfather        lymphoma  . Heart disease Paternal Grandfather   . Cancer Paternal Grandfather        pancreatic  . Pancreatic cancer Paternal Grandmother   . Pancreatic cancer Paternal Aunt      Current medication list and allergy/intolerance information reviewed:    Current Outpatient Medications  Medication Sig Dispense Refill  . amoxicillin-clavulanate (AUGMENTIN) 500-125 MG per tablet Take 1 tablet (500 mg total) by mouth every 12 (twelve) hours. 12 tablet 0  . ibuprofen (ADVIL,MOTRIN) 600 MG tablet Take 1 tablet (600 mg total) by mouth every 6 (six) hours. 30 tablet 1  . Prenatal Vit-Fe Fumarate-FA (PRENATAL MULTIVITAMIN) TABS tablet Take 1 tablet by mouth daily at 12 noon.     No current facility-administered medications for this visit.     No Known Allergies    Review of Systems:  Constitutional:  No  fever, no chills, No recent illness, No unintentional weight changes. No significant fatigue.   HEENT: No  headache, no vision change, no hearing change, No sore throat, No  sinus pressure  Cardiac: No  chest pain, No  pressure, No palpitations, No  Orthopnea  Respiratory:  No  shortness of breath. No  Cough  Gastrointestinal: No  abdominal pain, No  nausea, No  vomiting,  No  blood in stool, No  diarrhea, No  constipation   Musculoskeletal: No new myalgia/arthralgia  Skin: No  Rash, +other wounds/concerning lesions  Genitourinary: No  incontinence, No  abnormal genital bleeding, No abnormal genital discharge  Hem/Onc: No  easy bruising/bleeding, No  abnormal lymph node  Endocrine: No cold intolerance,  No heat intolerance. No polyuria/ +polydipsia  Neurologic: No  weakness, No  dizziness, No  slurred speech/focal weakness/facial droop  Psychiatric: No  concerns with depression, No  concerns with anxiety, No sleep problems, No mood problems  Exam:  BP (!) 111/59 (BP Location: Left Arm, Patient Position:  Sitting, Cuff Size: Large)   Pulse 65   Temp 98.5 F (36.9 C) (Oral)   Ht 5\' 7"  (1.702 m)   Wt 219 lb 14.4 oz (99.7 kg)   BMI 34.44 kg/m   Constitutional: VS see above. General Appearance: alert, well-developed, well-nourished, NAD  Eyes: Normal lids and conjunctive, non-icteric sclera  Ears, Nose, Mouth, Throat: MMM, Normal external inspection ears/nares/mouth/lips/gums. TM normal bilaterally. Pharynx/tonsils no erythema, no exudate. Nasal mucosa normal.   Neck: No masses, trachea midline. No thyroid enlargement. No tenderness/mass appreciated. No lymphadenopathy  Respiratory: Normal respiratory effort. no wheeze, no rhonchi, no rales  Cardiovascular: S1/S2 normal, no murmur, no rub/gallop auscultated. RRR. No lower extremity edema.  Gastrointestinal: Nontender, no masses. No hepatomegaly, no splenomegaly. No hernia appreciated. Bowel sounds normal. Rectal exam deferred.   Musculoskeletal: Gait normal. No clubbing/cyanosis of digits.   Neurological: Normal balance/coordination. No tremor. No cranial nerve deficit on limited exam.   Skin: warm, dry, intact. Benign appearing nevus on L upper back   Psychiatric: Normal judgment/insight. Normal mood and affect. Oriented x3.    No results found for this or any previous visit (from the past 72 hour(s)).  No results found.   ASSESSMENT/PLAN: The primary encounter diagnosis was Annual physical exam. Diagnoses of Need for influenza vaccination, History of gestational diabetes, Family history of early menopause, Hot flashes, Irregular periods, Polydipsia, and Benign nevus were also pertinent to this visit.   Offered cryotherapy of likely benign skin lesion, patient declined  Otherwise fairly healthy, training to run a marathon. Possible early perimenopause, defer to OBGYN   Orders Placed This Encounter  Procedures  . Flu Vaccine QUAD 6+ mos PF IM (Fluarix Quad PF)    No orders of the defined types were placed in this  encounter.   Patient Instructions  General Preventive Care  Most recent routine screening lipids/other labs: ordered today.   Cholesterol and Diabetes screening usually recommended annually.  Everyone should have blood pressure checked once per year. Yours looks good today!  Tobacco: don't!   Alcohol: responsible moderation is ok for most adults - if you have concerns about your alcohol intake, please talk to me!   Exercise: as tolerated to reduce risk of cardiovascular disease and diabetes. Strength training will also prevent osteoporosis.   Mental health: if need for mental health care (medicines, counseling, other), or concerns about moods, please let me know!   Sexual health: if need for STD testing, or if concerns with libido/pain problems, please let me or your OBGYN know!   Advanced Directive: Living Will and/or Healthcare Power of Attorney recommended for all adults, regardless of age or health.  Vaccines  Flu vaccine: recommended for almost everyone, every fall.   Shingles vaccine: Shingrix recommended after age 67.   Pneumonia vaccines: Prevnar and Pneumovax recommended after age 5  Tetanus booster: Tdap recommended every 10 years.  Cancer screenings   Colon cancer screening: recommended for everyone at age 44.  Breast cancer screening: mammogram records needed -  will request form OBGYN  Cervical cancer screening: Pap records needed - will request from OBGYN  Lung cancer screening: not needed for non-smokers  Infection screenings . HIV & Gonorrhea/Chlamydia: screening as needed . Hepatitis C: recommended for anyone born 1945-1965 so not needed for you . TB: certain at-risk populations, or depending on work requirements and/or travel history Other  Bone Density Test: recommended for women at age 17       Visit summary with medication list and pertinent instructions was printed for patient to review. All questions at time of visit were answered - patient  instructed to contact office with any additional concerns or updates. ER/RTC precautions were reviewed with the patient.    Please note: voice recognition software was used to produce this document, and typos may escape review. Please contact Dr. Sheppard Coil for any needed clarifications.     Follow-up plan: Return in about 1 year (around 08/05/2019) for annual check-up, sooner if needed or if anything concerning on blood work .

## 2018-08-04 NOTE — Patient Instructions (Addendum)
General Preventive Care  Most recent routine screening lipids/other labs: ordered today.   Cholesterol and Diabetes screening usually recommended annually.  Everyone should have blood pressure checked once per year. Yours looks good today!  Tobacco: don't!   Alcohol: responsible moderation is ok for most adults - if you have concerns about your alcohol intake, please talk to me!   Exercise: as tolerated to reduce risk of cardiovascular disease and diabetes. Strength training will also prevent osteoporosis.   Mental health: if need for mental health care (medicines, counseling, other), or concerns about moods, please let me know!   Sexual health: if need for STD testing, or if concerns with libido/pain problems, please let me or your OBGYN know!   Advanced Directive: Living Will and/or Healthcare Power of Attorney recommended for all adults, regardless of age or health.  Vaccines  Flu vaccine: recommended for almost everyone, every fall.   Shingles vaccine: Shingrix recommended after age 41.   Pneumonia vaccines: Prevnar and Pneumovax recommended after age 24  Tetanus booster: Tdap recommended every 10 years.  Cancer screenings   Colon cancer screening: recommended for everyone at age 74.  Breast cancer screening: mammogram records needed - will request form OBGYN  Cervical cancer screening: Pap records needed - will request from OBGYN  Lung cancer screening: not needed for non-smokers  Infection screenings . HIV & Gonorrhea/Chlamydia: screening as needed . Hepatitis C: recommended for anyone born 1945-1965 so not needed for you . TB: certain at-risk populations, or depending on work requirements and/or travel history Other  Bone Density Test: recommended for women at age 77

## 2018-08-14 LAB — COMPLETE METABOLIC PANEL WITH GFR
AG RATIO: 2 (calc) (ref 1.0–2.5)
ALT: 27 U/L (ref 6–29)
AST: 27 U/L (ref 10–30)
Albumin: 4.7 g/dL (ref 3.6–5.1)
Alkaline phosphatase (APISO): 103 U/L (ref 33–115)
BILIRUBIN TOTAL: 0.7 mg/dL (ref 0.2–1.2)
BUN: 15 mg/dL (ref 7–25)
CO2: 29 mmol/L (ref 20–32)
Calcium: 9.6 mg/dL (ref 8.6–10.2)
Chloride: 102 mmol/L (ref 98–110)
Creat: 0.86 mg/dL (ref 0.50–1.10)
GFR, EST NON AFRICAN AMERICAN: 83 mL/min/{1.73_m2} (ref >=60)
GFR, Est African American: 97 mL/min/{1.73_m2} (ref >=60)
GLOBULIN: 2.3 g/dL (ref 1.9–3.7)
Glucose, Bld: 103 mg/dL — ABNORMAL HIGH (ref 65–99)
POTASSIUM: 4.5 mmol/L (ref 3.5–5.3)
SODIUM: 140 mmol/L (ref 135–146)
Total Protein: 7 g/dL (ref 6.1–8.1)

## 2018-08-14 LAB — CBC
HEMATOCRIT: 46.9 % — AB (ref 35.0–45.0)
HEMOGLOBIN: 16.3 g/dL — AB (ref 11.7–15.5)
MCH: 29.9 pg (ref 27.0–33.0)
MCHC: 34.8 g/dL (ref 32.0–36.0)
MCV: 85.9 fL (ref 80.0–100.0)
MPV: 10.7 fL (ref 7.5–12.5)
Platelets: 219 10*3/uL (ref 140–400)
RBC: 5.46 10*6/uL — ABNORMAL HIGH (ref 3.80–5.10)
RDW: 13.1 % (ref 11.0–15.0)
WBC: 5.3 10*3/uL (ref 3.8–10.8)

## 2018-08-14 LAB — TSH: TSH: 1.79 mIU/L

## 2018-08-14 LAB — HEMOGLOBIN A1C
Hgb A1c MFr Bld: 5 % of total Hgb (ref <5.7)
MEAN PLASMA GLUCOSE: 97 (calc)
eAG (mmol/L): 5.4 (calc)

## 2018-08-14 LAB — LIPID PANEL
Cholesterol: 262 mg/dL — ABNORMAL HIGH (ref <200)
HDL: 59 mg/dL (ref >50)
LDL Cholesterol (Calc): 183 mg/dL (calc) — ABNORMAL HIGH
NON-HDL CHOLESTEROL (CALC): 203 mg/dL — AB (ref <130)
Total CHOL/HDL Ratio: 4.4 (calc) (ref <5.0)
Triglycerides: 86 mg/dL (ref <150)

## 2018-09-14 IMAGING — MG 2D DIGITAL DIAGNOSTIC BILATERAL MAMMOGRAM WITH CAD AND ADJUNCT T
8 of 18 series · 8 of 40 positions shown · non-contrast
Comparison: April 01, 2017

CLINICAL DATA: 40-year-old patient recalled from recent baseline
screening mammogram for possible bilateral masses.

EXAM:
2D DIGITAL DIAGNOSTIC BILATERAL MAMMOGRAM WITH CAD AND ADJUNCT TOMO
ULTRASOUND LEFT BREAST

[R CC synth-2D (1 of 2)]
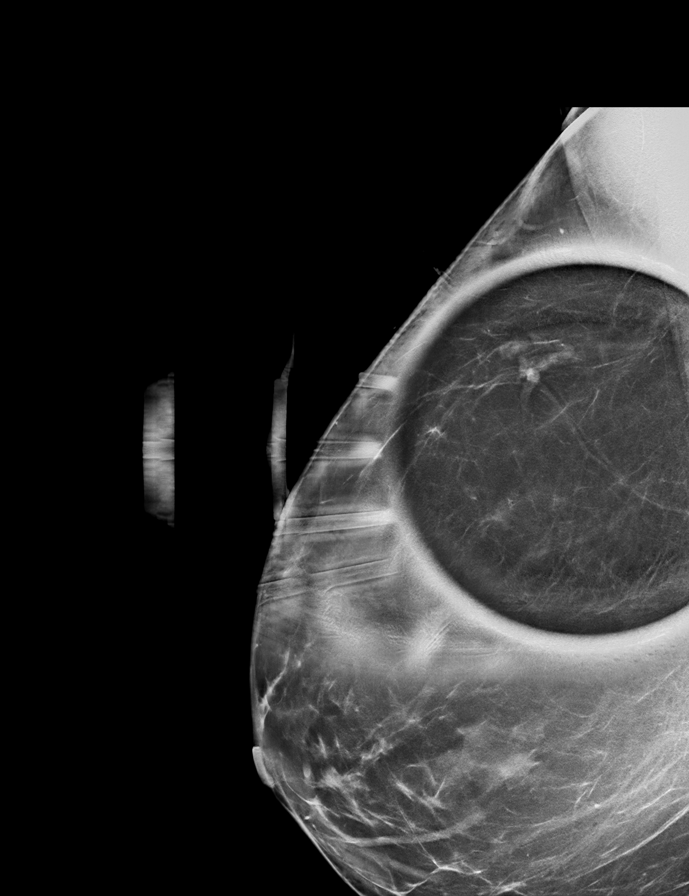

[R ML]
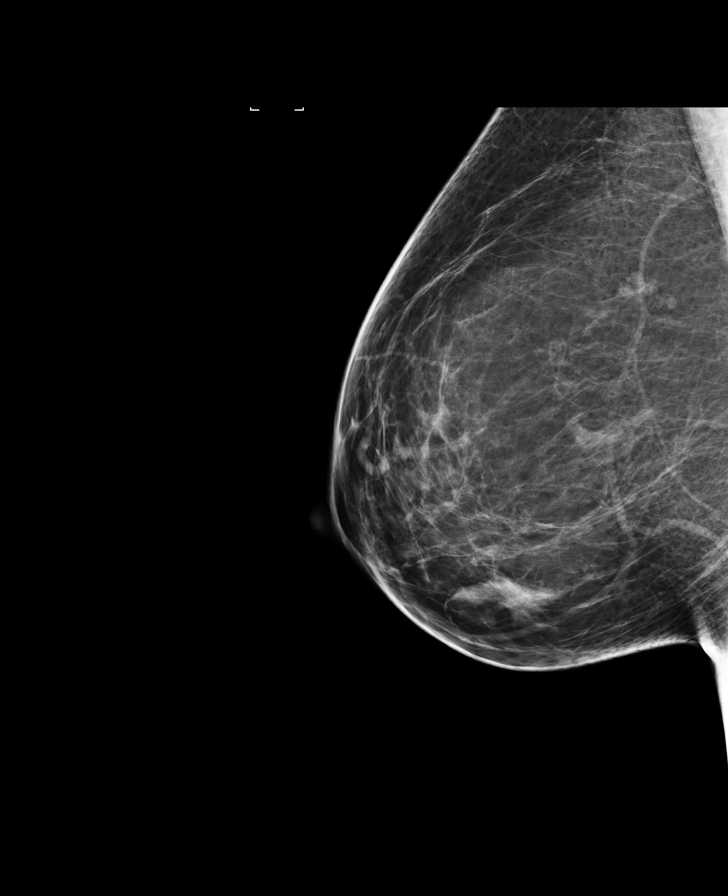

[L MLO]
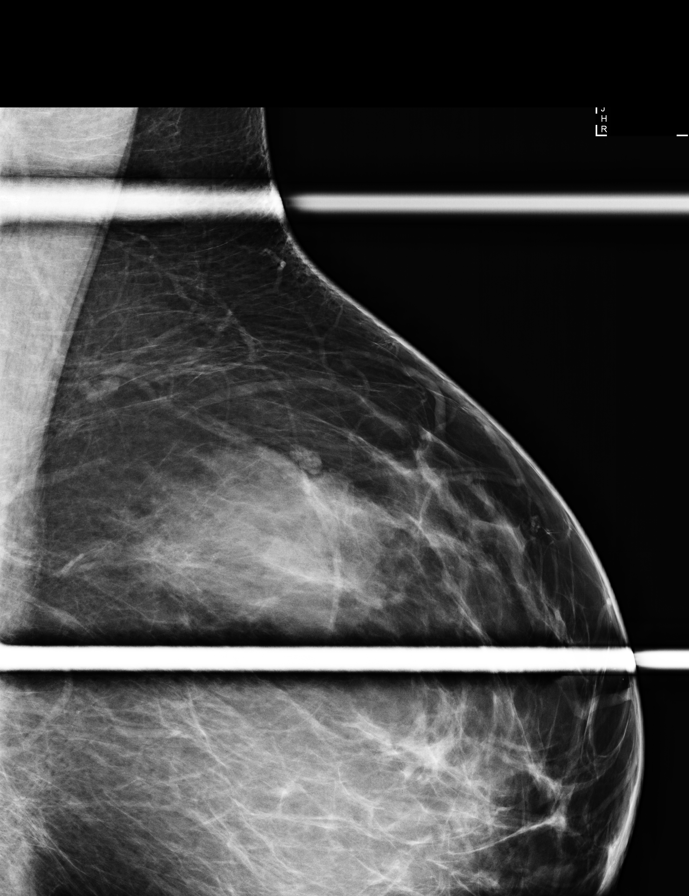

[R CC synth-2D (2 of 2)]
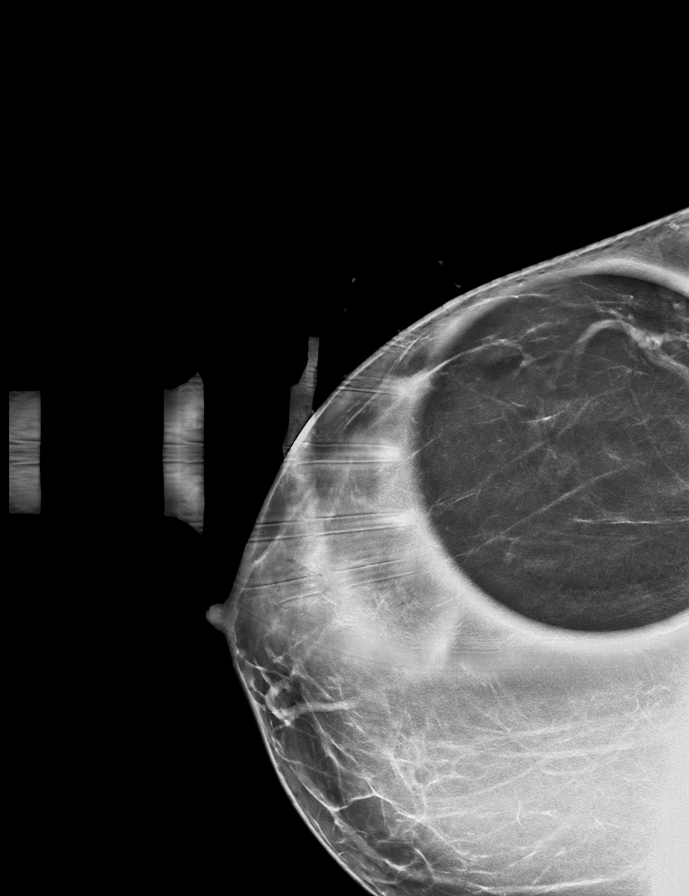

[R MLO synth-2D]
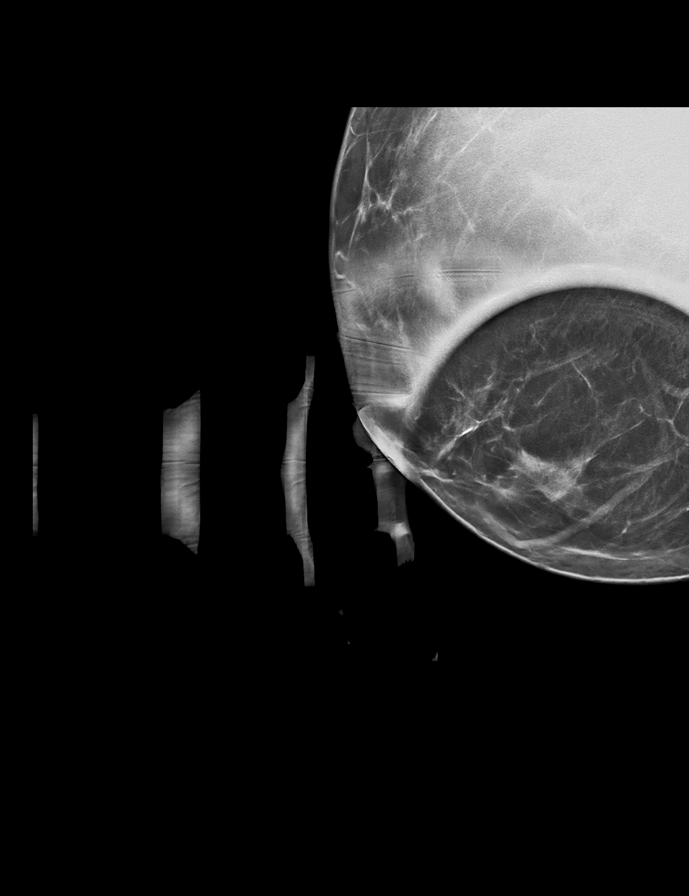

[R MLO]
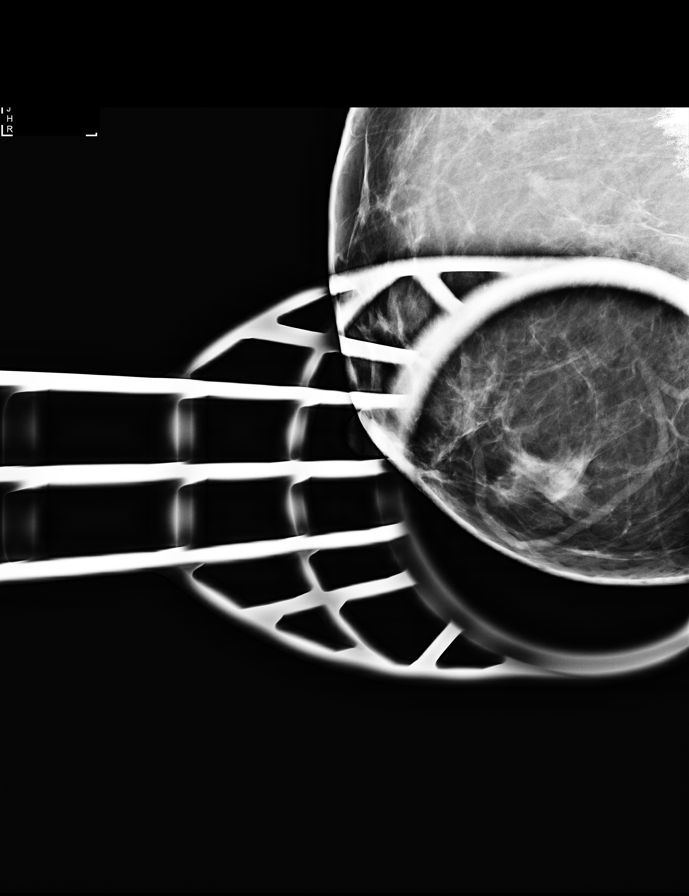

[L CC synth-2D]
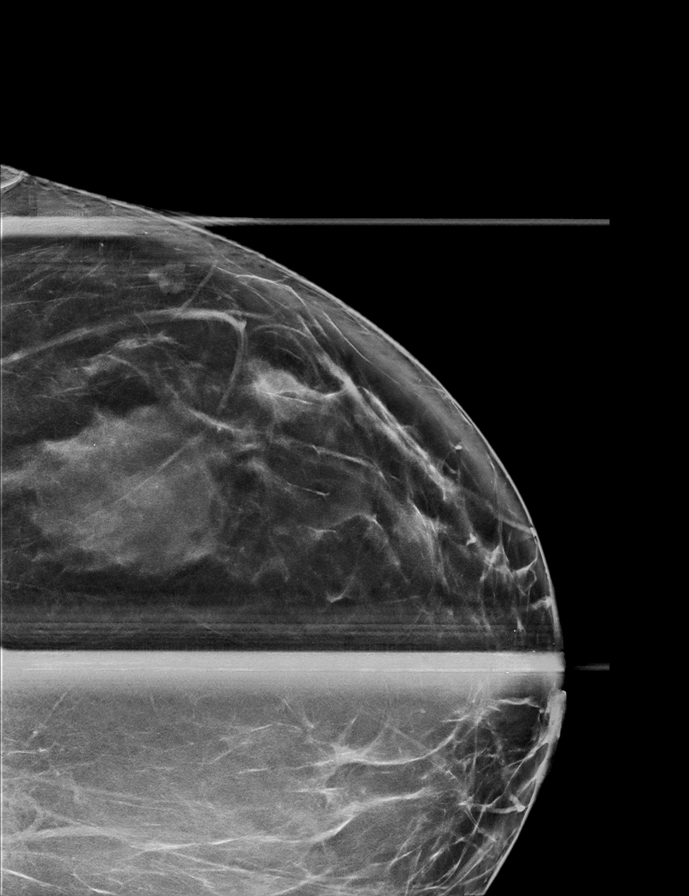

[R CC]
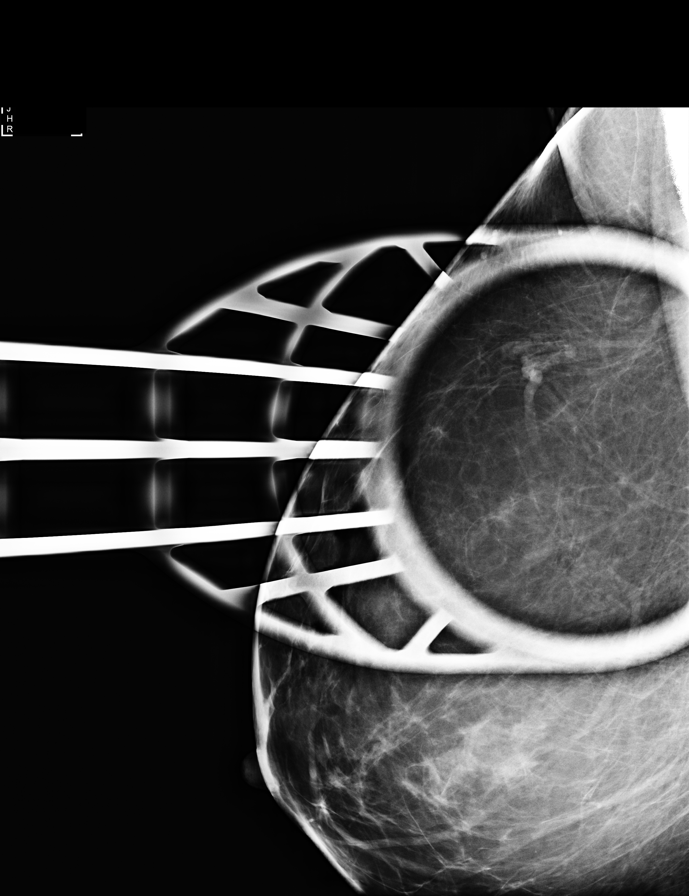

[8 of 40 positions shown; findings below may reference images not displayed]

ACR Breast Density Category b: There are scattered areas of
fibroglandular density.
FINDINGS: Additional tomographic views of the right breast show normal
appearing fibroglandular tissue in the inferior central right
breast. In the far outer right breast, normal intramammary lymph
nodes and a curvilinear vessel are present, accounting for the
possible mass identified on the screening mammogram. There are no
suspicious findings on the right.

On the left, there is an approximately 5 cm persistent asymmetry
favored to be an island of dense fibroglandular tissue. This area
will be further evaluated with ultrasound.

Mammographic images were processed with CAD.

On physical exam, there is no mass or thickening in the outer left
breast. The outer left breast is soft to palpation.

Targeted ultrasound is performed, showing an island of dense
fibroglandular tissue in the outer left breast, centered at [DATE]
position 10 cm from the nipple, accounting for the
asymmetry/possible mass on the mammogram.
IMPRESSION: No evidence of malignancy in either breast.

RECOMMENDATION:
Screening mammogram in one year.(Code:LY-W-FVN)

I have discussed the findings and recommendations with the patient.
Results were also provided in writing at the conclusion of the
visit. If applicable, a reminder letter will be sent to the patient
regarding the next appointment.

BI-RADS CATEGORY  1: Negative.

## 2018-09-14 IMAGING — US ULTRASOUND LEFT BREAST LIMITED
1 series · 3 of 3 positions shown · non-contrast
Comparison: April 01, 2017

CLINICAL DATA: 40-year-old patient recalled from recent baseline
screening mammogram for possible bilateral masses.

EXAM:
2D DIGITAL DIAGNOSTIC BILATERAL MAMMOGRAM WITH CAD AND ADJUNCT TOMO
ULTRASOUND LEFT BREAST

[Series 1: ultrasound left breast limited · 0.08mm/px · 3 of 3 slices shown]
[im 1/3]
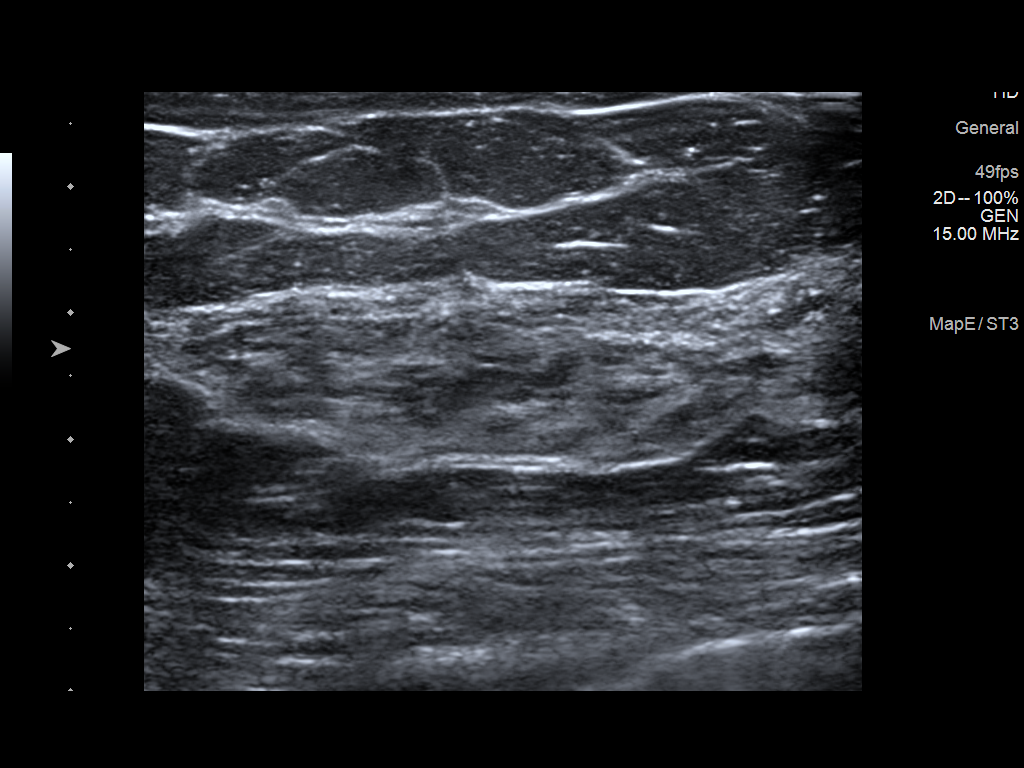
[im 2/3]
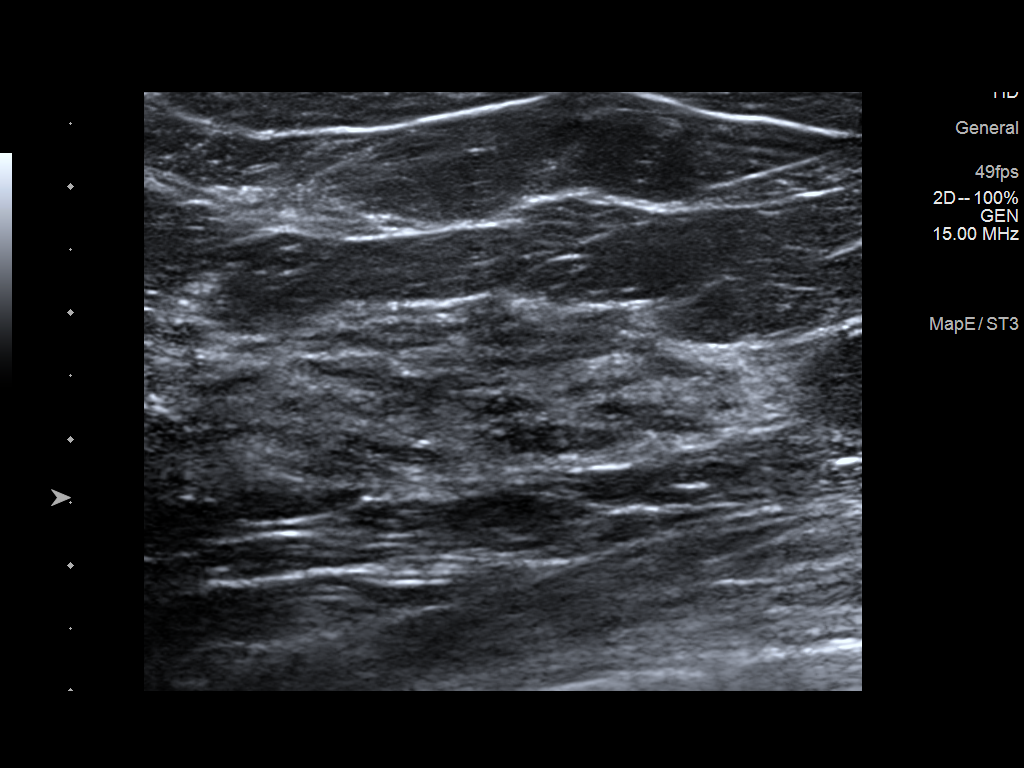
[im 3/3]
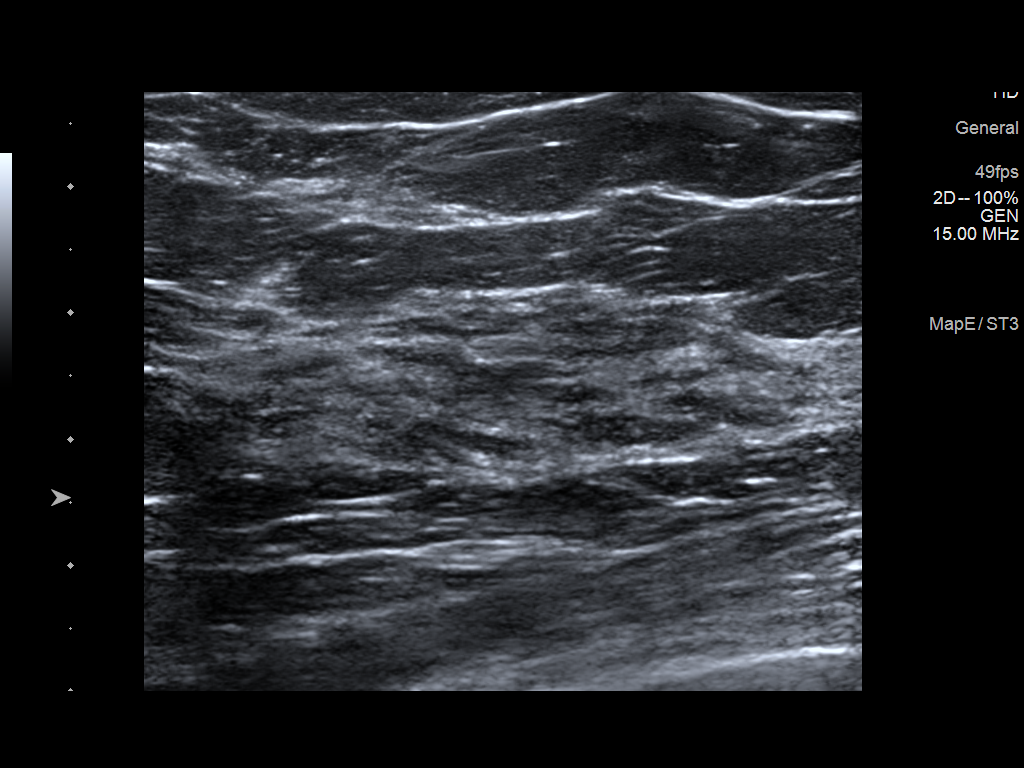

[3 of 3 positions shown; findings below may reference images not displayed]

ACR Breast Density Category b: There are scattered areas of
fibroglandular density.
FINDINGS: Additional tomographic views of the right breast show normal
appearing fibroglandular tissue in the inferior central right
breast. In the far outer right breast, normal intramammary lymph
nodes and a curvilinear vessel are present, accounting for the
possible mass identified on the screening mammogram. There are no
suspicious findings on the right.

On the left, there is an approximately 5 cm persistent asymmetry
favored to be an island of dense fibroglandular tissue. This area
will be further evaluated with ultrasound.

Mammographic images were processed with CAD.

On physical exam, there is no mass or thickening in the outer left
breast. The outer left breast is soft to palpation.

Targeted ultrasound is performed, showing an island of dense
fibroglandular tissue in the outer left breast, centered at [DATE]
position 10 cm from the nipple, accounting for the
asymmetry/possible mass on the mammogram.
IMPRESSION: No evidence of malignancy in either breast.

RECOMMENDATION:
Screening mammogram in one year.(Code:LY-[id number])

I have discussed the findings and recommendations with the patient.
Results were also provided in writing at the conclusion of the
visit. If applicable, a reminder letter will be sent to the patient
regarding the next appointment.

BI-RADS CATEGORY  1: Negative.

## 2018-10-22 ENCOUNTER — Encounter: Payer: Self-pay | Admitting: Family Medicine

## 2018-10-22 ENCOUNTER — Ambulatory Visit (INDEPENDENT_AMBULATORY_CARE_PROVIDER_SITE_OTHER): Payer: 59 | Admitting: Family Medicine

## 2018-10-22 VITALS — BP 143/75 | HR 62 | Ht 67.0 in | Wt 226.0 lb

## 2018-10-22 DIAGNOSIS — S61412A Laceration without foreign body of left hand, initial encounter: Secondary | ICD-10-CM | POA: Diagnosis not present

## 2018-10-22 NOTE — Progress Notes (Signed)
Mary Rocha is a 43 y.o. RHD female who presents to [facility] today for left hand laceration.  Patient was using a potato peeler yesterday evening preparing dinner.  She was holding the potato in her left hand and the potato peeler slipped stabbing her in the thenar eminence of her left hand.  She effectively peeled a small sliver of skin off the thenar eminence.  She washed the wound and applied dressing to stop bleeding.  The bleeding has stopped.  She denies any radiating pain weakness or numbness distally.  She feels well otherwise.  She can move her hand normally with only minimal pain.  Her last tetanus vaccine wa late December 2014 or early January 2015 at her OB/GYN office.   ROS as above:  Exam:  BP (!) 143/75   Pulse 62   Ht 5\' 7"  (1.702 m)   Wt 226 lb (102.5 kg)   BMI 35.40 kg/m  Wt Readings from Last 5 Encounters:  10/22/18 226 lb (102.5 kg)  08/04/18 219 lb 14.4 oz (99.7 kg)  11/02/13 245 lb (111.1 kg)  08/17/13 213 lb 14.4 oz (97 kg)    Gen: Well NAD Left hand: No deformities visible.  Small 1 cm area of skin avulsion extending through the dermis at the thenar eminence.  This bleeds when disrupted.  The laceration does not extend deep past the dermis. Thumb motion sensation strength capillary refill are intact distally.  Antibiotic ointment and a nonadherent dressing were applied.     Assessment and Plan: 43 y.o. female with  Left hand laceration.  Doing well.  Plan for heal with secondary intent.  Tetanus up-to-date.  Dressing applied.  Recheck if not improving.  We will [facility] medical [facility] to get detailed last Tdap vaccine history as well as Pap smear report.  PDMP not reviewed this encounter. No orders of the defined types were placed in this encounter.  No orders of the defined types  were placed in this encounter.    Historical information moved to improve visibility of documentation.  Past Medical History:  Diagnosis Date  . Allergy   . AMA (advanced maternal age) multigravida 10+   . Gestational diabetes   . Headache(784.0)   . History of cholecystectomy   . History of gestational diabetes 08/04/2018  . Newborn product of IVF pregnancy    Past Surgical History:  Procedure Laterality Date  . CESAREAN SECTION N/A 11/02/2013   Procedure: CESAREAN SECTION;  Surgeon: Darlyn Chamber, MD;  Location: [facility];  Service: Obstetrics;  Laterality: N/A;  . CHOLECYSTECTOMY    . TONSILLECTOMY     Social History   Tobacco Use  . Smoking status: Never Smoker  . Smokeless tobacco: Never Used  Substance Use Topics  . Alcohol use: Yes    Comment: 1 - 2 drinks / month   family history includes Cancer in her maternal grandfather and paternal grandfather; Diabetes in her maternal aunt; Heart disease in her paternal grandfather; Pancreatic cancer in her maternal grandmother, paternal aunt, and paternal grandmother; Thyroid disease in her maternal grandmother.  Medications: Current Outpatient Medications  Medication Sig Dispense Refill  . amoxicillin-clavulanate (AUGMENTIN) 500-125 MG per tablet Take 1 tablet (500 mg total) by mouth every 12 (twelve) hours. 12 tablet 0  . ibuprofen (ADVIL,MOTRIN) 600 MG tablet Take 1 tablet (600 mg total) by mouth every 6 (six) hours. 30 tablet 1  .  Prenatal Vit-Fe Fumarate-FA (PRENATAL MULTIVITAMIN) TABS tablet Take 1 tablet by mouth daily at 12 noon.     No current facility-administered medications for this visit.    No Known Allergies   Discussed warning signs or symptoms. Please see discharge instructions. Patient expresses understanding.

## 2018-10-22 NOTE — Patient Instructions (Addendum)
Thank you for coming in today. Keep the wound clean and covered with ointment.  Use non-adherent dressing.  It should take about 7-10 day to heal.  Let me know if you have any issues.    Laceration Care, Adult A laceration is a cut that may go through all layers of the skin and into the tissue that is right under the skin. Some lacerations heal on their own. Others need to be closed with stitches (sutures), staples, skin adhesive strips, or skin glue. Proper care of a laceration reduces the risk for infection, helps the laceration heal better, and may prevent scarring. How to care for your laceration Wash your hands with soap and water before touching your wound or changing your bandage (dressing). If soap and water are not available, use hand sanitizer. Keep the wound clean and dry. If you were given a dressing, you should change it at least once a day, or as told by your health care provider. You should also change it if it becomes wet or dirty. If sutures or staples were used:  Keep the wound completely dry for the first 24 hours, or as told by your health care provider. After that time, you may shower or bathe. However, make sure that the wound is not soaked in water until after the sutures or staples have been removed.  Clean the wound once each day, or as told by your health care provider: ? Wash the wound with soap and water. ? Rinse the wound with water to remove all soap. ? Pat the wound dry with a clean towel. Do not rub the wound.  After cleaning the wound, apply a thin layer of antibiotic ointment as told by your health care provider. This will help prevent infection and keep the dressing from sticking to the wound.  Have the sutures or staples removed as told by your health care provider. If skin adhesive strips were used:  Do not get the skin adhesive strips wet. You may shower or bathe, but be careful to keep the wound dry.  If the wound gets wet, pat it dry with a clean  towel. Do not rub the wound.  Skin adhesive strips fall off on their own. You may trim the strips as the wound heals. Do not remove skin adhesive strips that are still stuck to the wound. They will fall off in time. If skin glue was used:  Try to keep the wound dry, but you may briefly wet it in the shower or bath. Do not soak the wound in water, such as by swimming.  After you have showered or bathed, gently pat the wound dry with a clean towel. Do not rub the wound.  Do not do any activities that will make you sweat heavily until the skin glue has fallen off on its own.  Do not apply liquid, cream, or ointment medicine to the wound while the skin glue is in place. Using those may loosen the film before the wound has healed.  If a dressing is placed over the wound, be careful not to apply tape directly over the skin glue. Doing that may cause the glue to be pulled off before the wound has healed.  Do not pick at the glue. Skin glue usually remains in place for 5-10 days and then falls off the skin. General instructions   Take over-the-counter and prescription medicines only as told by your health care provider.  If you were prescribed an antibiotic medicine or ointment,  take or apply it as told by your health care provider. Do not stop using it even if your condition improves.  Do not scratch or pick at the wound.  Check your wound every day for signs of infection. Watch for: ? Redness, swelling, or pain. ? Fluid, blood, or pus.  Raise (elevate) the injured area above the level of your heart while you are sitting or lying down for the first 24-48 hours after the laceration is repaired.  If directed, put ice on the affected area: ? Put ice in a plastic bag. ? Place a towel between your skin and the bag. ? Leave the ice on for 20 minutes, 2-3 times a day.  Keep all follow-up visits as told by your health care provider. This is important. Contact a health care provider if:  You  received a tetanus shot and you have swelling, severe pain, redness, or bleeding at the injection site.  You have a fever.  A wound that was closed breaks open.  You notice a bad smell coming from your wound or your dressing.  You notice something coming out of the wound, such as wood or glass.  Your pain is not controlled with medicine.  You have increased redness, swelling, or pain at the site of your wound.  You have fluid, blood, or pus coming from your wound.  You need to change the dressing often due to fluid, blood, or pus that is draining from the wound.  You develop a new rash.  You develop numbness around the wound. Get help right away if:  You develop severe swelling around the wound.  Your pain suddenly increases and is severe.  You develop painful lumps near the wound or on skin anywhere else on your body.  You have a red streak going away from your wound.  The wound is on your hand or foot and you cannot properly move a finger or toe.  The wound is on your hand or foot, and you notice that your fingers or toes look pale or bluish. Summary  A laceration is a cut that may go through all layers of the skin and into the tissue that is right under the skin.  Some lacerations heal on their own. Others need to be closed with stitches (sutures), staples, skin adhesive strips, or skin glue.  Proper care of a laceration reduces the risk of infection, helps the laceration heal better, and prevents scarring. This information is not intended to replace advice given to you by your health care provider. Make sure you discuss any questions you have with your health care provider. Document Released: 08/12/2005 Document Revised: 09/01/2017 Document Reviewed: 09/01/2017 Elsevier Interactive Patient Education  2019 [employer].

## 2018-10-26 ENCOUNTER — Telehealth: Payer: Self-pay | Admitting: Family Medicine

## 2018-10-26 NOTE — Telephone Encounter (Signed)
[facility]  Mammogram performed April 22, 2018 normal.   Cervical cancer screening Pap smear performed April 20, 2018.  Negative pathology/cytology.  HPV not detected.  Tdap form seen.  Tdap given July 29, 2013/  Patient had [facility] gene testing which was negative.  She does not have increased risk for breast cancer.

## 2018-10-28 ENCOUNTER — Telehealth: Payer: Self-pay | Admitting: Family Medicine

## 2018-10-28 NOTE — Telephone Encounter (Signed)
Note opened in error.

## 2018-12-07 ENCOUNTER — Encounter: Payer: Self-pay | Admitting: Osteopathic Medicine

## 2018-12-28 ENCOUNTER — Encounter: Payer: Self-pay | Admitting: Osteopathic Medicine

## 2019-03-07 ENCOUNTER — Encounter: Payer: Self-pay | Admitting: Emergency Medicine

## 2019-03-07 ENCOUNTER — Other Ambulatory Visit: Payer: Self-pay

## 2019-03-07 ENCOUNTER — Emergency Department
Admission: EM | Admit: 2019-03-07 | Discharge: 2019-03-07 | Disposition: A | Discharge status: Nursing Home (Includes ICF) | Payer: 59 | Source: Home / Self Care

## 2019-03-07 DIAGNOSIS — R1030 Lower abdominal pain, unspecified: Secondary | ICD-10-CM | POA: Diagnosis not present

## 2019-03-07 LAB — POCT URINE PREGNANCY: Preg Test, Ur: NEGATIVE

## 2019-03-07 LAB — POCT URINALYSIS DIP (MANUAL ENTRY)
Bilirubin, UA: NEGATIVE
Blood, UA: NEGATIVE
Glucose, UA: NEGATIVE mg/dL
Ketones, POC UA: NEGATIVE mg/dL
Leukocytes, UA: NEGATIVE
Nitrite, UA: NEGATIVE
Protein Ur, POC: NEGATIVE mg/dL
Spec Grav, UA: 1.01 (ref 1.010–1.025)
Urobilinogen, UA: 0.2 E.U./dL
pH, UA: 6.5 (ref 5.0–8.0)

## 2019-03-07 NOTE — ED Provider Notes (Signed)
[name]    CSN: [id number] Arrival date & time: 03/07/19  1311     History   Chief Complaint Chief Complaint  Patient presents with  . Abdominal Cramping    HPI Mary Rocha is a 43 y.o. female.   HPI  Mary Rocha is a 43 y.o. female presenting to UC with c/o severe sudden onset lower abdominal pain that's brought her to her knees this morning. Pain described as severe menstrual cramps.  She is very irregular, last cycle was 3 months ago.  Pain is worse with deep breath but pain has gradually improved on its own throughout the day. Pain is currently 2/10, no pain medication taken.  She did have 1 episode of loose stool but denies blood in stool. Denies fever, chills, vomiting but slight nausea earlier. Denies urinary symptoms. Denies hx of ovarian cysts or fibroids. She had her gallbladder removed years ago but still has her appendix.  No hx of kidney stones.    Past Medical History:  Diagnosis Date  . Allergy   . AMA (advanced maternal age) multigravida 86+   . Gestational diabetes   . Headache(784.0)   . History of cholecystectomy   . History of gestational diabetes 08/04/2018  . Newborn product of IVF pregnancy     Patient Active Problem List   Diagnosis Date Noted  . Benign nevus 08/04/2018  . Irregular periods 08/04/2018  . Family history of early menopause 08/04/2018  . History of gestational diabetes 08/04/2018  . S/P cesarean section 11/02/2013    Class: Status post    Past Surgical History:  Procedure Laterality Date  . CESAREAN SECTION N/A 11/02/2013   Procedure: CESAREAN SECTION;  Surgeon: Darlyn Chamber, MD;  Location: [facility];  Service: Obstetrics;  Laterality: N/A;  . CHOLECYSTECTOMY    . TONSILLECTOMY      OB History    Gravida  2   Para  1   Term  1   Preterm      AB  1   Living  1     SAB  1   TAB      Ectopic      Multiple      Live Births  1            Home Medications    Prior to  Admission medications   Medication Sig Start Date End Date Taking? Authorizing Provider  amoxicillin-clavulanate (AUGMENTIN) 500-125 MG per tablet Take 1 tablet (500 mg total) by mouth every 12 (twelve) hours. 11/05/13   Juanda Chance, NP  ibuprofen (ADVIL,MOTRIN) 600 MG tablet Take 1 tablet (600 mg total) by mouth every 6 (six) hours. 11/05/13   Juanda Chance, NP  Prenatal Vit-Fe Fumarate-FA (PRENATAL MULTIVITAMIN) TABS tablet Take 1 tablet by mouth daily at 12 noon.    [provider]    Family History Family History  Problem Relation Age of Onset  . Diabetes Maternal Aunt   . Thyroid disease Maternal Grandmother   . Pancreatic cancer Maternal Grandmother   . Cancer Maternal Grandfather        lymphoma  . Heart disease Paternal Grandfather   . Cancer Paternal Grandfather        pancreatic  . Pancreatic cancer Paternal Grandmother   . Pancreatic cancer Paternal Aunt     Social History Social History   Tobacco Use  . Smoking status: Never Smoker  . Smokeless tobacco: Never Used  Substance Use Topics  . Alcohol  use: Yes    Comment: 1 - 2 drinks / month  . Drug use: Never     Allergies   Patient has no known allergies.   Review of Systems Review of Systems  Constitutional: Negative for chills, diaphoresis, fatigue and fever.  Respiratory: Negative for chest tightness and shortness of breath.   Cardiovascular: Negative for chest pain, palpitations and leg swelling.  Gastrointestinal: Positive for abdominal pain, diarrhea (one episode loose stool) and nausea (minimal). Negative for blood in stool and vomiting.  Genitourinary: Negative for dysuria, flank pain, frequency, hematuria, vaginal bleeding, vaginal discharge and vaginal pain.  Musculoskeletal: Negative for back pain.  Neurological: Negative for dizziness, light-headedness and headaches.     Physical Exam Triage Vital Signs ED Triage Vitals  Enc Vitals Group     BP 03/07/19 1520 115/76     Pulse Rate  03/07/19 1520 62     Resp 03/07/19 1520 18     Temp 03/07/19 1520 98.3 F (36.8 C)     Temp Source 03/07/19 1520 Oral     SpO2 03/07/19 1520 100 %     Weight 03/07/19 1521 228 lb (103.4 kg)     Height 03/07/19 1521 5\' 7"  (1.702 m)     Head Circumference --      Peak Flow --      Pain Score 03/07/19 1520 2     Pain Loc --      Pain Edu? --      Excl. in [place]? --    No data found.  Updated Vital Signs BP 115/76 (BP Location: Right Arm)   Pulse 62   Temp 98.3 F (36.8 C) (Oral)   Resp 18   Ht 5\' 7"  (1.702 m)   Wt 228 lb (103.4 kg)   SpO2 100%   BMI 35.71 kg/m   Visual Acuity Right Eye Distance:   Left Eye Distance:   Bilateral Distance:    Right Eye Near:   Left Eye Near:    Bilateral Near:     Physical Exam Vitals signs and nursing note reviewed.  Constitutional:      General: She is not in acute distress.    Appearance: Normal appearance. She is well-developed. She is not ill-appearing, toxic-appearing or diaphoretic.  HENT:     Head: Normocephalic and atraumatic.     Mouth/Throat:     Mouth: Mucous membranes are moist.  Neck:     Musculoskeletal: Normal range of motion.  Cardiovascular:     Rate and Rhythm: Normal rate and regular rhythm.  Pulmonary:     Effort: Pulmonary effort is normal.     Breath sounds: Normal breath sounds.  Abdominal:     Palpations: Abdomen is soft.     Tenderness: There is abdominal tenderness. There is no right CVA tenderness, left CVA tenderness, guarding or rebound.    Musculoskeletal: Normal range of motion.  Skin:    General: Skin is warm and dry.  Neurological:     Mental Status: She is alert and oriented to person, place, and time.  Psychiatric:        Behavior: Behavior normal.      UC Treatments / Results  Labs (all labs ordered are listed, but only abnormal results are displayed) Labs Reviewed  POCT URINALYSIS DIP (MANUAL ENTRY) - Abnormal; Notable for the following components:      Result Value   Color, UA  light yellow (*)    All other components within normal limits  POCT URINE PREGNANCY    EKG   Radiology No results found.  Procedures Procedures (including critical care time)  Medications Ordered in UC Medications - No data to display  Initial Impression / Assessment and Plan / UC Course  I have reviewed the triage vital signs and the nursing notes.  Pertinent labs & imaging results that were available during my care of the patient were reviewed by me and considered in my medical decision making (see chart for details).     Pt appears well, NAD. Vitals: WNL Concern for severity of pain pt describes from this morning. Pt tender across lower abdomen, predominantly to Right lower side.  UA: Normal, no evidence of UTI and no blood to suggest stone. Encouraged f/u in ED for further evaluation as pt will benefit from CT and/or ultrasound for further evaluation. Pt declined EMS transport, agreeable with driving herself POV to [facility] for further evaluation.  Final Clinical Impressions(s) / UC Diagnoses   Final diagnoses:  Lower abdominal pain     Discharge Instructions      Please continue to the emergency department. Do not eat or drink along the way. They will be able to perform stat labs and imaging to help determine the cause of your pain and treat appropriately.     ED Prescriptions    None     Controlled Substance Prescriptions [place] Controlled Substance Registry consulted? Not Applicable   Tyrell Antonio 03/07/19 2703

## 2019-03-07 NOTE — Discharge Instructions (Signed)
°  Please continue to the emergency department. Do not eat or drink along the way. They will be able to perform stat labs and imaging to help determine the cause of your pain and treat appropriately.

## 2019-03-07 NOTE — ED Triage Notes (Signed)
Awoke today with lower abdominal cramping; thought it may be onset menses which are very irregular and last one 3 months ago; then noticed pain worse with intake of breath and she was slightly nauseated. She has had infertility issues in past. She has not travelled in past 4 weeks.

## 2019-08-26 ENCOUNTER — Other Ambulatory Visit: Payer: Self-pay

## 2019-08-26 ENCOUNTER — Encounter: Payer: Self-pay | Admitting: Medical-Surgical

## 2019-08-26 ENCOUNTER — Ambulatory Visit (INDEPENDENT_AMBULATORY_CARE_PROVIDER_SITE_OTHER): Payer: 59 | Admitting: Medical-Surgical

## 2019-08-26 VITALS — BP 95/59 | HR 64 | Temp 98.1°F | Ht 67.0 in | Wt 229.0 lb

## 2019-08-26 DIAGNOSIS — K649 Unspecified hemorrhoids: Secondary | ICD-10-CM | POA: Insufficient documentation

## 2019-08-26 MED ORDER — DOCUSATE SODIUM 100 MG PO CAPS: 100.0000 mg | ORAL_CAPSULE | Freq: Three times a day (TID) | ORAL | 3 refills | Status: DC | PRN

## 2019-08-26 MED ORDER — HYDROCORTISONE ACETATE 25 MG RE SUPP: 25.0000 mg | Freq: Two times a day (BID) | RECTAL | 0 refills | Status: DC

## 2019-08-26 NOTE — Progress Notes (Signed)
Subjective:    CC: rectal bleeding  HPI:  Pleasant 43 year old female presenting today with complaints of bright red blood on toilet paper after having a bowel movements starting 2 nights ago.  Yesterday, noted a larger amount of blood on tissue and some in the water, stool brown.  Today there was less blood with her bowel movement but it is still bright red.  Denies fever and itching, irritation, soreness or tenderness of the rectal area.  Last menstrual cycle ended 4 days ago but does still report occasional lower abdomen cramping.  Bowel movements described as firm, stool brown.  Reports drinking ~96 ounces of water per day at a minimum. Tries to eat plenty of fruits and vegetables.  Has had hemorrhoids in the past with very small amounts of spotting after bowel movements.  Treated these with over-the-counter remedies successfully.  Reports grandmother had history of diverticulitis but no other GI family history pertinent.  I reviewed the past medical history, family history, social history, surgical history, and allergies today and no changes were needed.  Please see the problem list section below in epic for further details.  Past Medical History: Past Medical History:  Diagnosis Date  . Allergy   . AMA (advanced maternal age) multigravida 74+   . Gestational diabetes   . Headache(784.0)   . History of cholecystectomy   . History of gestational diabetes 08/04/2018  . Newborn product of IVF pregnancy   . Ovarian cyst    Past Surgical History: Past Surgical History:  Procedure Laterality Date  . CESAREAN SECTION N/A 11/02/2013   Procedure: CESAREAN SECTION;  Surgeon: Darlyn Chamber, MD;  Location: [facility];  Service: Obstetrics;  Laterality: N/A;  . CHOLECYSTECTOMY    . TONSILLECTOMY     Social History: Social History   Socioeconomic History  . Marital status: Married    Spouse name: Monita Makin  . Number of children: 1  . Years of education: Not on file  . Highest  education level: Not on file  Occupational History  . Occupation: SPEECH LANGUAGE PATHOLOGIST    Employer: [employer]  Tobacco Use  . Smoking status: Never Smoker  . Smokeless tobacco: Never Used  Substance and Sexual Activity  . Alcohol use: Yes    Comment: 1 - 2 drinks / month  . Drug use: Never  . Sexual activity: Yes    Partners: Male    Birth control/protection: None    Comment: Infertility  Other Topics Concern  . Not on file  Social History Narrative  . Not on file   Social Determinants of Health   Financial Resource Strain:   . Difficulty of Paying Living Expenses: Not on file  Food Insecurity:   . Worried About Charity fundraiser in the Last Year: Not on file  . Ran Out of Food in the Last Year: Not on file  Transportation Needs:   . Lack of Transportation (Medical): Not on file  . Lack of Transportation (Non-Medical): Not on file  Physical Activity:   . Days of Exercise per Week: Not on file  . Minutes of Exercise per Session: Not on file  Stress:   . Feeling of Stress : Not on file  Social Connections:   . Frequency of Communication with Friends and Family: Not on file  . Frequency of Social Gatherings with Friends and Family: Not on file  . Attends Religious Services: Not on file  . Active Member of Clubs or Organizations: Not  on file  . Attends Archivist Meetings: Not on file  . Marital Status: Not on file   Family History: Family History  Problem Relation Age of Onset  . Diabetes Maternal Aunt   . Thyroid disease Maternal Grandmother   . Pancreatic cancer Maternal Grandmother   . Cancer Maternal Grandfather        lymphoma  . Heart disease Paternal Grandfather   . Cancer Paternal Grandfather        pancreatic  . Pancreatic cancer Paternal Grandmother   . Pancreatic cancer Paternal Aunt    Allergies: No Known Allergies Medications: See med rec.  Review of Systems: No fevers, chills, night sweats, weight  loss, chest pain, or shortness of breath.   Objective:    General: Well Developed, well nourished, and in no acute distress.  Neuro: Alert and oriented x3.  HEENT: Normocephalic, atraumatic.  Skin: Warm and dry. Cardiac: Regular rate and rhythm, no murmurs rubs or gallops, no lower extremity edema.  Respiratory: Clear to auscultation bilaterally. Not using accessory muscles, speaking in full sentences. Abdomen: Soft, nontender, nondistended.  Bowel sounds positive x4 quadrants.  Digital rectal exam performed.  Internal and external hemorrhoids noted.  Trace positive result on Hemoccult card.    Impression and Recommendations:    Hemorrhoids Anusol suppositories twice daily.  Colace 100 mg up to 3 times daily as needed.  Increase dietary fiber and fluid intake.  Consider adding OTC fiber supplement.  May use over-the-counter witch hazel, etc. if desired.  Information on hemorrhoids and dietary fiber provided in patient instructions.  Return if symptoms worsen or fail to improve. ___________________________________________ Clearnce Sorrel, DNP, APRN, FNP-BC [facility]

## 2019-08-26 NOTE — Assessment & Plan Note (Addendum)
Anusol suppositories twice daily.  Colace 100 mg up to 3 times daily as needed.  Increase dietary fiber and fluid intake.  Consider adding OTC fiber supplement.  May use over-the-counter witch hazel, etc. if desired.  Information on hemorrhoids and dietary fiber provided in patient instructions.

## 2019-08-26 NOTE — Patient Instructions (Signed)
Hemorrhoids Hemorrhoids are swollen veins in and around the rectum or anus. There are two types of hemorrhoids:  Internal hemorrhoids. These occur in the veins that are just inside the rectum. They may poke through to the outside and become irritated and painful.  External hemorrhoids. These occur in the veins that are outside the anus and can be felt as a painful swelling or hard lump near the anus. Most hemorrhoids do not cause serious problems, and they can be managed with home treatments such as diet and lifestyle changes. If home treatments do not help the symptoms, procedures can be done to shrink or remove the hemorrhoids. What are the causes? This condition is caused by increased pressure in the anal area. This pressure may result from various things, including:  Constipation.  Straining to have a bowel movement.  Diarrhea.  Pregnancy.  Obesity.  Sitting for long periods of time.  Heavy lifting or other activity that causes you to strain.  Anal sex.  Riding a bike for a long period of time. What are the signs or symptoms? Symptoms of this condition include:  Pain.  Anal itching or irritation.  Rectal bleeding.  Leakage of stool (feces).  Anal swelling.  One or more lumps around the anus. How is this diagnosed? This condition can often be diagnosed through a visual exam. Other exams or tests may also be done, such as:  An exam that involves feeling the rectal area with a gloved hand (digital rectal exam).  An exam of the anal canal that is done using a small tube (anoscope).  A blood test, if you have lost a significant amount of blood.  A test to look inside the colon using a flexible tube with a camera on the end (sigmoidoscopy or colonoscopy). How is this treated? This condition can usually be treated at home. However, various procedures may be done if dietary changes, lifestyle changes, and other home treatments do not help your symptoms. These  procedures can help make the hemorrhoids smaller or remove them completely. Some of these procedures involve surgery, and others do not. Common procedures include:  Rubber band ligation. Rubber bands are placed at the base of the hemorrhoids to cut off their blood supply.  Sclerotherapy. Medicine is injected into the hemorrhoids to shrink them.  Infrared coagulation. A type of light energy is used to get rid of the hemorrhoids.  Hemorrhoidectomy surgery. The hemorrhoids are surgically removed, and the veins that supply them are tied off.  Stapled hemorrhoidopexy surgery. The surgeon staples the base of the hemorrhoid to the rectal wall. Follow these instructions at home: Eating and drinking   Eat foods that have a lot of fiber in them, such as whole grains, beans, nuts, fruits, and vegetables.  Ask your health care provider about taking products that have added fiber (fiber supplements).  Reduce the amount of fat in your diet. You can do this by eating low-fat dairy products, eating less red meat, and avoiding processed foods.  Drink enough fluid to keep your urine pale yellow. Managing pain and swelling   Take warm sitz baths for 20 minutes, 3-4 times a day to ease pain and discomfort. You may do this in a bathtub or using a portable sitz bath that fits over the toilet.  If directed, apply ice to the affected area. Using ice packs between sitz baths may be helpful. ? Put ice in a plastic bag. ? Place a towel between your skin and the bag. ? Leave   the ice on for 20 minutes, 2-3 times a day. General instructions  Take over-the-counter and prescription medicines only as told by your health care provider.  Use medicated creams or suppositories as told.  Get regular exercise. Ask your health care provider how much and what kind of exercise is best for you. In general, you should do moderate exercise for at least 30 minutes on most days of the week (150 minutes each week). This can  include activities such as walking, biking, or yoga.  Go to the bathroom when you have the urge to have a bowel movement. Do not wait.  Avoid straining to have bowel movements.  Keep the anal area dry and clean. Use wet toilet paper or moist towelettes after a bowel movement.  Do not sit on the toilet for long periods of time. This increases blood pooling and pain.  Keep all follow-up visits as told by your health care provider. This is important. Contact a health care provider if you have:  Increasing pain and swelling that are not controlled by treatment or medicine.  Difficulty having a bowel movement, or you are unable to have a bowel movement.  Pain or inflammation outside the area of the hemorrhoids. Get help right away if you have:  Uncontrolled bleeding from your rectum. Summary  Hemorrhoids are swollen veins in and around the rectum or anus.  Most hemorrhoids can be managed with home treatments such as diet and lifestyle changes.  Taking warm sitz baths can help ease pain and discomfort.  In severe cases, procedures or surgery can be done to shrink or remove the hemorrhoids. This information is not intended to replace advice given to you by your health care provider. Make sure you discuss any questions you have with your health care provider. Document Revised: 01/08/2019 Document Reviewed: 01/01/2018 Elsevier Patient Education  [place].   Cardinal Health Content in Foods  See the following list for the dietary fiber content of some common foods. High-fiber foods High-fiber foods contain 4 grams or more (4g or more) of fiber per serving. They include:  Artichoke (fresh) -- 1 medium has 10.3g of fiber.  Baked beans, plain or vegetarian (canned) --  cup has 5.2g of fiber.  Blackberries or raspberries (fresh) --  cup has 4g of fiber.  Bran cereal --  cup has 8.6g of fiber.  Bulgur (cooked) --  cup has 4g of fiber.  Kidney beans (canned) --  cup has 6.8g  of fiber.  Lentils (cooked) --  cup has 7.8g of fiber.  Pear (fresh) -- 1 medium has 5.1g of fiber.  Peas (frozen) --  cup has 4.4g of fiber.  Pinto beans (canned) --  cup has 5.5g of fiber.  Pinto beans (dried and cooked) --  cup has 7.7g of fiber.  Potato with skin (baked) -- 1 medium has 4.4g of fiber.  Quinoa (cooked) --  cup has 5g of fiber.  Soybeans (canned, frozen, or fresh) --  cup has 5.1g of fiber. Moderate-fiber foods Moderate-fiber foods contain 1-4 grams (1-4g) of fiber per serving. They include:  Almonds -- 1 oz. has 3.5g of fiber.  Apple with skin -- 1 medium has 3.3g of fiber.  Applesauce, sweetened --  cup has 1.5g of fiber.  Bagel, plain -- one 4-inch (10-cm) bagel has 2g of fiber.  Banana -- 1 medium has 3.1g of fiber.  Broccoli (cooked) --  cup has 2.5g of fiber.  Carrots (cooked) --  cup has 2.3g of fiber.  Corn (canned or frozen) --  cup has 2.1g of fiber.  Corn tortilla -- one 6-inch (15-cm) tortilla has 1.5g of fiber.  Green beans (canned) --  cup has 2g of fiber.  Instant oatmeal --  cup has about 2g of fiber.  Long-grain brown rice (cooked) -- 1 cup has 3.5g of fiber.  Macaroni, enriched (cooked) -- 1 cup has 2.5g of fiber.  Melon -- 1 cup has 1.4g of fiber.  Multigrain cereal --  cup has about 2-4g of fiber.  Orange -- 1 small has 3.1g of fiber.  Potatoes, mashed --  cup has 1.6g of fiber.  Raisins -- 1/4 cup has 1.6g of fiber.  Squash --  cup has 2.9g of fiber.  Sunflower seeds --  cup has 1.1g of fiber.  Tomato -- 1 medium has 1.5g of fiber.  Vegetable or soy patty -- 1 has 3.4g of fiber.  Whole-wheat bread -- 1 slice has 2g of fiber.  Whole-wheat spaghetti --  cup has 3.2g of fiber. Low-fiber foods Low-fiber foods contain less than 1 gram (less than 1g) of fiber per serving. They include:  Egg -- 1 large.  Flour tortilla -- one 6-inch (15-cm) tortilla.  Fruit juice --  cup.  Lettuce -- 1  cup.  Meat, poultry, or fish -- 1 oz.  Milk -- 1 cup.  Spinach (raw) -- 1 cup.  White bread -- 1 slice.  White rice --  cup.  Yogurt --  cup. Actual amounts of fiber in foods may be different depending on processing. Talk with your dietitian about how much fiber you need in your diet. This information is not intended to replace advice given to you by your health care provider. Make sure you discuss any questions you have with your health care provider. Document Revised: 04/04/2016 Document Reviewed: 10/05/2015 Elsevier Patient Education  [place].

## 2020-04-11 ENCOUNTER — Ambulatory Visit (INDEPENDENT_AMBULATORY_CARE_PROVIDER_SITE_OTHER): Payer: 59 | Admitting: Osteopathic Medicine

## 2020-04-11 ENCOUNTER — Encounter: Payer: Self-pay | Admitting: Osteopathic Medicine

## 2020-04-11 VITALS — BP 111/68 | HR 64 | Temp 98.3°F | Ht 64.25 in | Wt 236.0 lb

## 2020-04-11 DIAGNOSIS — Z8632 Personal history of gestational diabetes: Secondary | ICD-10-CM

## 2020-04-11 DIAGNOSIS — Z6841 Body Mass Index (BMI) 40.0 and over, adult: Secondary | ICD-10-CM | POA: Diagnosis not present

## 2020-04-11 DIAGNOSIS — Z0000 Encounter for general adult medical examination without abnormal findings: Secondary | ICD-10-CM

## 2020-04-11 NOTE — Patient Instructions (Addendum)
General Preventive Care  Most recent routine screening labs: ordered.   Blood pressure goal 130/80 or less.   Tobacco: don't!   Alcohol: responsible moderation is ok for most adults - if you have concerns about your alcohol intake, please talk to me!   Exercise: as tolerated to reduce risk of cardiovascular disease and diabetes. Strength training will also prevent osteoporosis.   Mental health: if need for mental health care (medicines, counseling, other), or concerns about moods, please let me know!   Sexual / Reproductive health: if need for STD testing, or if concerns with libido/pain problems, or if you need to discuss family planningplease let me or OBGYN know!  Advanced Directive: Living Will and/or Healthcare Power of Attorney recommended for all adults, regardless of age or health.  Vaccines  Flu vaccine: for almost everyone, every fall.   Shingles vaccine: after age 83.   Pneumonia vaccines: after age 75  Tetanus booster: every 10 years (due 2024)  COVID vaccine:  Cancer screenings   Colon cancer screening: for everyone age 18-75.   Breast cancer screening: mammogram at age 80 every other year at least, and annually after age 13.   Cervical cancer screening: Pap every 1 to 5 years depending on age and other risk factors.   Lung cancer screening: not needed for non-smokers  Infection screenings  . HIV: recommended screening at least once age 60-65 . Gonorrhea/Chlamydia: screening as needed. . Hepatitis C: recommended once for everyone age 47-75 . TB: certain at-risk populations, or depending on work requirements and/or travel history Other . Bone Density Test: recommended for women at age 35             Weight loss: important things to remember  Things to remember for exercise for weight loss:   Please note - I am not a certified personal trainer. I can present you with ideas and general workout goals, but an exercise program is largely up to you.  Find something you can stick with, and something you enjoy!   Slow progression will help prevent injury!  As you progress in your exercise regimen think about gradually increasing the following, week by week:   intensity (how strenuous is your workout?)  frequency (how often are you exercising?)  duration (how many minutes at a time are you exercising?)  Walking for 20 minutes a day is certainly better than nothing, but more strenuous exercise will develop better cardiovascular fitness.   interval training (high-intensity alternating with low-intensity, think walk/jog rather than just walk)  muscle strengthening exercises (weight lifting, calisthenics, yoga) - this also helps prevent osteoporosis!   Things to remember for diet changes for weight loss:   Please note - I am not a certified dietician. I can present you with ideas and general diet goals, but a meal plan is largely up to you. I am happy to refer you to a dietician who can give you a detailed meal plan.  Apps/logs can be helpful to track how you're eating! It's not realistic to be logging everything you eat forever, but when you're starting a healthy eating lifestyle it's very helpful, and checking in with logs now and then helps you stick to your program!   Calorie restriction / portion control with the goal weight loss of no more than one to one and a half pounds per week.   Increase lean protein such as chicken, fish, [place].   Decrease fatty foods such as dairy, butter.   Decrease sugary foods and  sweets. Avoid sugary drinks such as soda or juice.  Increase fiber found in fruit and vegetables, whole grains.   Medications approved for long-term use for obesity  Qsymia (Phentermine + Topiramate) - may help migraines/headaches  Saxenda (Liraglutide daily injections) - will also help diabetes/prediabetes  Wegovy (Semaglutide weekly injections) - will also help diabetes/prediabetes  Contrave (Bupropion +  Naltrexone) - may help mental health/depression   Orlistat (Xenical Rx, Alli OTC) - not my favorite, can cause diarrhea  Bupropion (Wellbutrin) - generic antidepressant, will also help mental health, may help with quitting smoking  I recommend that you research the above medications and see which one(s) your insurance may or may not cover: If you call your insurance, ask them specifically what medications are on their formulary that are approved for obesity treatment. They should be able to send you a list or tell you over the phone. Remember, medications aren't magic! You MUST be diligent about lifestyle changes as well!

## 2020-04-11 NOTE — Progress Notes (Signed)
Mary Rocha is a 44 y.o. female who presents to  [place] at [facility]  today, 04/11/20, seeking care for the following:   Annual physical      [place] with other pertinent findings:  The primary encounter diagnosis was Annual physical exam. Diagnoses of Class 3 severe obesity without serious comorbidity with body mass index (BMI) of 40.0 to 44.9 in adult, unspecified obesity type ([place]) and History of gestational diabetes were also pertinent to this visit.   No results found for this or any previous visit (from the past 24 hour(s)).  Patient Instructions  General Preventive Care  Most recent routine screening labs: ordered.   Blood pressure goal 130/80 or less.   Tobacco: don't!   Alcohol: responsible moderation is ok for most adults - if you have concerns about your alcohol intake, please talk to me!   Exercise: as tolerated to reduce risk of cardiovascular disease and diabetes. Strength training will also prevent osteoporosis.   Mental health: if need for mental health care (medicines, counseling, other), or concerns about moods, please let me know!   Sexual / Reproductive health: if need for STD testing, or if concerns with libido/pain problems, or if you need to discuss family planningplease let me or OBGYN know!  Advanced Directive: Living Will and/or Healthcare Power of Attorney recommended for all adults, regardless of age or health.  Vaccines  Flu vaccine: for almost everyone, every fall.   Shingles vaccine: after age 77.   Pneumonia vaccines: after age 72  Tetanus booster: every 10 years (due 2024)  COVID vaccine:  Cancer screenings   Colon cancer screening: for everyone age 4-75.   Breast cancer screening: mammogram at age 54 every other year at least, and annually after age 64.   Cervical cancer screening: Pap every 1 to 5 years depending on age and other risk factors.   Lung cancer  screening: not needed for non-smokers  Infection screenings   HIV: recommended screening at least once age 103-65  Gonorrhea/Chlamydia: screening as needed.  Hepatitis C: recommended once for everyone age 48-18  TB: certain at-risk populations, or depending on work requirements and/or travel history Other  Bone Density Test: recommended for women at age 32             Weight loss: important things to remember  Things to remember for exercise for weight loss:   Please note - I am not a certified Physiological scientist. I can present you with ideas and general workout goals, but an exercise program is largely up to you. Find something you can stick with, and something you enjoy!   Slow progression will help prevent injury!  As you progress in your exercise regimen think about gradually increasing the following, week by week:   intensity (how strenuous is your workout?)  frequency (how often are you exercising?)  duration (how many minutes at a time are you exercising?)  Walking for 20 minutes a day is certainly better than nothing, but more strenuous exercise will develop better cardiovascular fitness.   interval training (high-intensity alternating with low-intensity, think walk/jog rather than just walk)  muscle strengthening exercises (weight lifting, calisthenics, yoga) - this also helps prevent osteoporosis!   Things to remember for diet changes for weight loss:   Please note - I am not a certified dietician. I can present you with ideas and general diet goals, but a meal plan is largely up to you. I am  happy to refer you to a dietician who can give you a detailed meal plan.  Apps/logs can be helpful to track how you're eating! It's not realistic to be logging everything you eat forever, but when you're starting a healthy eating lifestyle it's very helpful, and checking in with logs now and then helps you stick to your program!   Calorie restriction / portion control  with the goal weight loss of no more than one to one and a half pounds per week.   Increase lean protein such as chicken, fish, [place].   Decrease fatty foods such as dairy, butter.   Decrease sugary foods and sweets. Avoid sugary drinks such as soda or juice.  Increase fiber found in fruit and vegetables, whole grains.   Medications approved for long-term use for obesity  Qsymia (Phentermine + Topiramate) - may help migraines/headaches  Saxenda (Liraglutide daily injections) - will also help diabetes/prediabetes  Wegovy (Semaglutide weekly injections) - will also help diabetes/prediabetes  Contrave (Bupropion + Naltrexone) - may help mental health/depression   Orlistat (Xenical Rx, Alli OTC) - not my favorite, can cause diarrhea  Bupropion (Wellbutrin) - generic antidepressant, will also help mental health, may help with quitting smoking  I recommend that you research the above medications and see which one(s) your insurance may or may not cover: If you call your insurance, ask them specifically what medications are on their formulary that are approved for obesity treatment. They should be able to send you a list or tell you over the phone. Remember, medications aren't magic! You MUST be diligent about lifestyle changes as well!      Orders Placed This Encounter  Procedures   CBC   COMPLETE METABOLIC PANEL WITH GFR   Lipid panel   TSH   Hemoglobin A1c   Amb ref to Medical Nutrition Therapy-MNT    No orders of the defined types were placed in this encounter.  Immunization History  Administered Date(s) Administered   Influenza,inj,Quad PF,6+ Mos 11/04/2013, 08/04/2018   Influenza-Unspecified 06/08/2019   Tdap 07/29/2013    Constitutional:   VSS, see nurse notes  General Appearance: alert, well-developed, well-nourished, NAD Eyes:  Normal lids and conjunctive, non-icteric sclera  PERRLA Ears, Nose, Mouth, Throat:  Normal appearance  Normal external  auditory canal and TM bilaterally Neck:  No masses, trachea midline  No thyroid enlargement/tenderness/mass appreciated Respiratory:  Normal respiratory effort  Breath sounds normal, no wheeze/rhonchi/rales Cardiovascular:  S1/S2 normal, no murmur/rub/gallop auscultated  No lower extremity edema Gastrointestinal:  Nontender, no masses  No hepatomegaly, no splenomegaly  No hernia appreciated Musculoskeletal:   Gait normal  No clubbing/cyanosis of digits Neurological:  No cranial nerve deficit on limited exam  Motor and sensation intact and symmetric Psychiatric:  Normal judgment/insight  Normal mood and affect    Follow-up instructions: Return in about 1 year (around 04/11/2021) for ANNUAL (call week prior to visit for lab orders).                                         BP 111/68    Pulse 64    Temp 98.3 F (36.8 C) (Oral)    Ht 5' 4.25" (1.632 m)    Wt 236 lb (107 kg)    SpO2 99% Comment: on RA   BMI 40.19 kg/m   Current Meds  Medication Sig   norgestimate-ethinyl estradiol (SPRINTEC 28) 0.25-35 MG-MCG  tablet Take 1 tablet by mouth daily.    No results found for this or any previous visit (from the past 72 hour(s)).  No results found.     All questions at time of visit were answered - patient instructed to contact office with any additional concerns or updates.  ER/RTC precautions were reviewed with the patient as applicable.   Please note: voice recognition software was used to produce this document, and typos may escape review. Please contact Dr. Sheppard Coil for any needed clarifications.

## 2020-04-20 ENCOUNTER — Encounter: Payer: 59 | Attending: Osteopathic Medicine | Admitting: Dietician

## 2020-04-20 ENCOUNTER — Other Ambulatory Visit: Payer: Self-pay

## 2020-04-20 ENCOUNTER — Encounter: Payer: Self-pay | Admitting: Dietician

## 2020-04-20 DIAGNOSIS — E669 Obesity, unspecified: Secondary | ICD-10-CM | POA: Diagnosis not present

## 2020-04-20 NOTE — Progress Notes (Signed)
Medical Nutrition Therapy:  Appt start time: 0900 end time:  1000.   Assessment:  Primary concerns today: Weight loss.    Pt reports wanting guidance with nutrition, and lifelong habits. Pt reports feeling confused based on all the different nutrition recommendations and diets out there.  Pt reports not being a picky eater, but her husband is. Her daughter is willing to try most things. Pt does the cooking, and gets her kid to help with preparing food. Pt reports going to a wellness center recently and didn't feel that it went well. She was prescribed a weight loss aid. They also contraindicated exercise because it would lead to more hunger. Pt reports boredom eating. Mainly at night after her kid goes to sleep. Pt reports snacking while reading, playing on her phone, or sitting still. Starts at 8:30 at night. Pt reports snacks are gone before she realizes sometimes.  Pt reports eating breakfast on the go most days. Pt reports mindful eating, taking her time while eating dinner for now. Pt reports time and planning may be a roadblock in eating balanced consistently.  Pt weighs once a week. Pt wants to increase her performance for physical activity.  Preferred Learning Style:   Auditory  Visual    Learning Readiness:   Ready   MEDICATIONS: Birth Control   DIETARY INTAKE:  Usual eating pattern includes 3 meals and 1-2 snacks per day.  Everyday foods include Avocado toast, tea.  Avoided foods include none listed.    24-hr recall:  B ( 7:30 AM): banana, tea with splash of 2% milk Snk ( AM): none  L (12:30 PM): oatmeal with strawberry/rhubarb Snk ( 2 PM): granola bar D ( PM): 2-3 Scramble eggs, hashbrown, biscuit with peach apricot jelly.  Snk ( PM): decaf tea Beverages: Tea, 96 oz of water  Usual physical activity: Yoga, going on walks, Peloton bike   Progress Towards Goal(s):  In progress.   Nutritional Diagnosis:  NB-1.1 Food and nutrition-related knowledge deficit  As related to no prior nutrition education.  As evidenced by pt seeking guidance for nutrition, and attempted weight loss.    Intervention:  Nutrition Education.  Counsel patient on beginning to reestablish trust in herself to listen to her body's cues, and to make the right decisions for her health. Encourage patient to exercise regularly and try to hit 150 minutes per week of physical activity. Explore possible solutions to mitigate boredom snacking in the evenings, as well as ways to become more mindful of her intent to engage in bored or emotional eating. Educate patient on the importance of balanced nutrition at each meal, specifically trying to get a protein source with breakfast consistently. Advise patient to weigh every other week, or once a month. Educate patient on the importance of developing consistent habits for long term success.   Goals:   Plan and prepare a healthier snack when you have your evening snacks. This will give you a chance to think about how you are feeling at the moment. Eat your snack one piece at a time to allow yourself to be more present with the food.  TRUST IN YOURSELF!  Work to meet the proportions of non-starchy vegetables, starch, and protein on the balanced plate model!  Exercise all you want! It all counts.  Continue mindful eating and giving yourself pause to assess hunger!  Teaching Method Utilized:  Visual Auditory   Handouts given during visit include:  Balanced Plate   Barriers to learning/adherence to lifestyle change: None  Demonstrated degree of understanding via:  Teach Back   Monitoring/Evaluation:  Dietary intake, exercise, and body weight prn.

## 2020-04-20 NOTE — Patient Instructions (Addendum)
Goals:  Plan and prepare a healthier snack when you have your evening snacks. This will give you a chance to think about how you are feeling at the moment. Eat your snack one piece at a time to allow yourself to be more present with the food.  TRUST IN YOURSELF!  Work to meet the proportions of non-starchy vegetables, starch, and protein on the balanced plate model!  Exercise all you want! It all counts.  Continue mindful eating and giving yourself pause to assess hunger!

## 2020-04-26 LAB — COMPLETE METABOLIC PANEL WITH GFR
AG Ratio: 1.8 (calc) (ref 1.0–2.5)
ALT: 30 U/L — ABNORMAL HIGH (ref 6–29)
AST: 26 U/L (ref 10–30)
Albumin: 4.2 g/dL (ref 3.6–5.1)
Alkaline phosphatase (APISO): 94 U/L (ref 31–125)
BUN: 13 mg/dL (ref 7–25)
CO2: 30 mmol/L (ref 20–32)
Calcium: 9.1 mg/dL (ref 8.6–10.2)
Chloride: 103 mmol/L (ref 98–110)
Creat: 0.86 mg/dL (ref 0.50–1.10)
GFR, Est African American: 96 mL/min/{1.73_m2} (ref >=60)
GFR, Est Non African American: 83 mL/min/{1.73_m2} (ref >=60)
Globulin: 2.4 g/dL (calc) (ref 1.9–3.7)
Glucose, Bld: 108 mg/dL — ABNORMAL HIGH (ref 65–99)
Potassium: 4.4 mmol/L (ref 3.5–5.3)
Sodium: 139 mmol/L (ref 135–146)
Total Bilirubin: 0.8 mg/dL (ref 0.2–1.2)
Total Protein: 6.6 g/dL (ref 6.1–8.1)

## 2020-04-26 LAB — CBC
HCT: 44.1 % (ref 35.0–45.0)
Hemoglobin: 15.2 g/dL (ref 11.7–15.5)
MCH: 30 pg (ref 27.0–33.0)
MCHC: 34.5 g/dL (ref 32.0–36.0)
MCV: 87 fL (ref 80.0–100.0)
MPV: 10.1 fL (ref 7.5–12.5)
Platelets: 205 10*3/uL (ref 140–400)
RBC: 5.07 10*6/uL (ref 3.80–5.10)
RDW: 13.1 % (ref 11.0–15.0)
WBC: 5.8 10*3/uL (ref 3.8–10.8)

## 2020-04-26 LAB — LIPID PANEL
Cholesterol: 277 mg/dL — ABNORMAL HIGH (ref <200)
HDL: 65 mg/dL (ref >=50)
LDL Cholesterol (Calc): 188 mg/dL (calc) — ABNORMAL HIGH
Non-HDL Cholesterol (Calc): 212 mg/dL (calc) — ABNORMAL HIGH (ref <130)
Total CHOL/HDL Ratio: 4.3 (calc) (ref <5.0)
Triglycerides: 117 mg/dL (ref <150)

## 2020-04-27 LAB — HEMOGLOBIN A1C
Hgb A1c MFr Bld: 5.3 % of total Hgb (ref <5.7)
Mean Plasma Glucose: 105 (calc)
eAG (mmol/L): 5.8 (calc)

## 2020-04-27 LAB — TSH: TSH: 1.69 mIU/L

## 2020-05-04 ENCOUNTER — Encounter: Payer: Self-pay | Admitting: Osteopathic Medicine

## 2020-05-04 ENCOUNTER — Ambulatory Visit (INDEPENDENT_AMBULATORY_CARE_PROVIDER_SITE_OTHER): Payer: 59 | Admitting: Osteopathic Medicine

## 2020-05-04 VITALS — BP 116/68 | HR 70 | Wt 239.0 lb

## 2020-05-04 DIAGNOSIS — E7849 Other hyperlipidemia: Secondary | ICD-10-CM | POA: Diagnosis not present

## 2020-05-04 MED ORDER — SIMVASTATIN 20 MG PO TABS: 20.0000 mg | ORAL_TABLET | Freq: Every day | ORAL | 3 refills | Status: DC

## 2020-05-04 NOTE — Patient Instructions (Signed)
Preventing High Cholesterol Cholesterol is a white, waxy substance similar to fat that the human body needs to help build cells. The liver makes all the cholesterol that a person's body needs. Having high cholesterol (hypercholesterolemia) increases a person's risk for heart disease and stroke. Extra (excess) cholesterol comes from the food the person eats. High cholesterol can often be prevented with diet and lifestyle changes. If you already have high cholesterol, you can control it with diet and lifestyle changes and with medicine. How can high cholesterol affect me? If you have high cholesterol, deposits (plaques) may build up on the walls of your arteries. The arteries are the blood vessels that carry blood away from your heart. Plaques make the arteries narrower and stiffer. This can limit or block blood flow and cause blood clots to form. Blood clots:  Are tiny balls of cells that form in your blood.  Can move to the heart or brain, causing a heart attack or stroke. Plaques in arteries greatly increase your risk for heart attack and stroke.Making diet and lifestyle changes can reduce your risk for these conditions that may threaten your life. What can increase my risk? This condition is more likely to develop in people who:  Eat foods that are high in saturated fat or cholesterol. Saturated fat is mostly found in: ? Foods that contain animal fat, such as red meat and some dairy products. ? Certain fatty foods made from plants, such as tropical oils.  Are overweight.  Are not getting enough exercise.  Have a family history of high cholesterol. What actions can I take to prevent this? Nutrition   Eat less saturated fat.  Avoid trans fats (partially hydrogenated oils). These are often found in margarine and in some baked goods, fried foods, and snacks bought in packages.  Avoid precooked or cured meat, such as sausages or meat loaves.  Avoid foods and drinks that have added  sugars.  Eat more fruits, vegetables, and whole grains.  Choose healthy sources of protein, such as fish, poultry, lean cuts of red meat, beans, peas, lentils, and nuts.  Choose healthy sources of fat, such as: ? Nuts. ? Vegetable oils, especially olive oil. ? Fish that have healthy fats (omega-3 fatty acids), such as mackerel or salmon. The items listed above may not be a complete list of recommended foods and beverages. Contact a dietitian for more information. Lifestyle  Lose weight if you are overweight. Losing 5-10 lb (2.3-4.5 kg) can help prevent or control high cholesterol. It can also lower your risk for diabetes and high blood pressure. Ask your health care provider to help you with a diet and exercise plan to lose weight safely.  Do not use any products that contain nicotine or tobacco, such as cigarettes, e-cigarettes, and chewing tobacco. If you need help quitting, ask your health care provider.  Limit your alcohol intake. ? Do not drink alcohol if:  Your health care provider tells you not to drink.  You are pregnant, may be pregnant, or are planning to become pregnant. ? If you drink alcohol:  Limit how much you use to:  0-1 drink a day for women.  0-2 drinks a day for men.  Be aware of how much alcohol is in your drink. In the [place] one drink equals one 12 oz bottle of beer (355 mL), one 5 oz glass of wine (148 mL), or one 1 oz glass of hard liquor (44 mL). Activity   Get enough exercise. Each week, do at   least 150 minutes of exercise that takes a medium level of effort (moderate-intensity exercise). ? This is exercise that:  Makes your heart beat faster and makes you breathe harder than usual.  Allows you to still be able to talk. ? You could exercise in short sessions several times a day or longer sessions a few times a week. For example, on 5 days each week, you could walk fast or ride your bike 3 times a day for 10 minutes each time.  Do exercises as told  by your health care provider. Medicines  In addition to diet and lifestyle changes, your health care provider may recommend medicines to help lower cholesterol. This may be a medicine to lower the amount of cholesterol your liver makes. You may need medicine if: ? Diet and lifestyle changes do not lower your cholesterol enough. ? You have high cholesterol and other risk factors for heart disease or stroke.  Take over-the-counter and prescription medicines only as told by your health care provider. General information  Manage your risk factors for high cholesterol. Talk with your health care provider about all your risk factors and how to lower your risk.  Manage other conditions that you have, such as diabetes or high blood pressure (hypertension).  Have blood tests to check your cholesterol levels at regular points in time as told by your health care provider.  Keep all follow-up visits as told by your health care provider. This is important. Where to find more information  American Heart Association: www.heart.org  National Heart, Lung, and Blood Institute: www.nhlbi.nih.gov Summary  High cholesterol increases your risk for heart disease and stroke. By keeping your cholesterol level low, you can reduce your risk for these conditions.  High cholesterol can often be prevented with diet and lifestyle changes.  Work with your health care provider to manage your risk factors, and have your blood tested regularly. This information is not intended to replace advice given to you by your health care provider. Make sure you discuss any questions you have with your health care provider. Document Revised: 12/04/2018 Document Reviewed: 04/20/2016 Elsevier Patient Education  2020 [employer]  

## 2020-05-04 NOTE — Progress Notes (Signed)
Mary Rocha is a 44 y.o. female who presents to  [place] at [facility]  today, 05/04/20, seeking care for the following:  . 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 Will start lower potency statin and recheck in 6 weeks      Patient Instructions  Preventing High Cholesterol Cholesterol is a white, waxy substance similar to fat that the human body needs to help build cells. The liver makes all the cholesterol that a person's body needs. Having high cholesterol (hypercholesterolemia) increases a person's risk for heart disease and stroke. Extra (excess) cholesterol comes from the food the person eats. High cholesterol can often be prevented with diet and lifestyle changes. If you already have high cholesterol, you can control it with diet and lifestyle changes and with medicine. How can high cholesterol affect me? If you have high cholesterol, deposits (plaques) may build up on the walls of your arteries. The arteries are the blood vessels that carry blood away from your heart. Plaques make the arteries narrower and stiffer. This can limit or block blood flow and cause blood clots to form. Blood clots:  Are tiny balls of cells that form in your blood.  Can move to the heart or brain, causing a heart attack or stroke. Plaques in arteries greatly increase your risk for heart attack and stroke.Making diet and lifestyle changes can reduce your risk for these conditions that may threaten your life. What can increase my risk? This condition is more likely to develop in people who:  Eat foods that are high in saturated fat or cholesterol. Saturated fat is mostly found in: ? Foods that contain animal fat, such as red meat and some dairy products. ? Certain  fatty foods made from plants, such as tropical oils.  Are overweight.  Are not getting enough exercise.  Have a family history of high cholesterol. What actions can I take to prevent this? Nutrition  Eat less saturated fat.  Avoid trans fats (partially hydrogenated oils). These are often found in margarine and in some baked goods, fried foods, and snacks bought in packages.  Avoid precooked or cured meat, such as sausages or meat loaves.  Avoid foods and drinks that have added sugars.  Eat more fruits, vegetables, and whole grains.  Choose healthy sources of protein, such as fish, poultry, lean cuts of red meat, beans, peas, lentils, and nuts.  Choose healthy sources of fat, such as: ? Nuts. ? Vegetable oils, especially olive oil. ? Fish that have healthy fats (omega-3 fatty acids), such as mackerel or salmon. The items listed above may not be a complete list of recommended foods and beverages. Contact a dietitian for more information. Lifestyle  Lose weight if you are overweight. Losing 5-10 lb (2.3-4.5 kg) can help prevent or control high cholesterol. It can also lower your risk for diabetes and high blood pressure. Ask your health care provider to help you with a diet and exercise plan to lose weight safely.  Do not use any products that contain nicotine or tobacco, such as cigarettes, e-cigarettes, and chewing tobacco. If you need help quitting, ask your health care provider.  Limit your alcohol intake. ? Do not drink alcohol if:  Your health care provider tells you not to drink.  You are pregnant,  may be pregnant, or are planning to become pregnant. ? If you drink alcohol:  Limit how much you use to:  0-1 drink a day for women.  0-2 drinks a day for men.  Be aware of how much alcohol is in your drink. In the [place] one drink equals one 12 oz bottle of beer (355 mL), one 5 oz glass of wine (148 mL), or one 1 oz glass of hard liquor (44 mL). Activity   Get enough  exercise. Each week, do at least 150 minutes of exercise that takes a medium level of effort (moderate-intensity exercise). ? This is exercise that:  Makes your heart beat faster and makes you breathe harder than usual.  Allows you to still be able to talk. ? You could exercise in short sessions several times a day or longer sessions a few times a week. For example, on 5 days each week, you could walk fast or ride your bike 3 times a day for 10 minutes each time.  Do exercises as told by your health care provider. Medicines  In addition to diet and lifestyle changes, your health care provider may recommend medicines to help lower cholesterol. This may be a medicine to lower the amount of cholesterol your liver makes. You may need medicine if: ? Diet and lifestyle changes do not lower your cholesterol enough. ? You have high cholesterol and other risk factors for heart disease or stroke.  Take over-the-counter and prescription medicines only as told by your health care provider. General information  Manage your risk factors for high cholesterol. Talk with your health care provider about all your risk factors and how to lower your risk.  Manage other conditions that you have, such as diabetes or high blood pressure (hypertension).  Have blood tests to check your cholesterol levels at regular points in time as told by your health care provider.  Keep all follow-up visits as told by your health care provider. This is important. Where to find more information  American Heart Association: www.heart.org  National Heart, Lung, and Blood Institute: https://wilson-eaton.com/ Summary  High cholesterol increases your risk for heart disease and stroke. By keeping your cholesterol level low, you can reduce your risk for these conditions.  High cholesterol can often be prevented with diet and lifestyle changes.  Work with your health care provider to manage your risk factors, and have your blood tested  regularly. This information is not intended to replace advice given to you by your health care provider. Make sure you discuss any questions you have with your health care provider. Document Revised: 12/04/2018 Document Reviewed: 04/20/2016 Elsevier Patient Education  2020 [place] This Encounter  Procedures  . Lipid panel  . Hepatic function panel    Meds ordered this encounter  Medications  . simvastatin (ZOCOR) 20 MG tablet    Sig: Take 1 tablet (20 mg total) by mouth daily.    Dispense:  90 tablet    Refill:  3       Follow-up instructions: Return in about 6 weeks (around 06/15/2020) for recheck cholesterol levels - LAB VISIT ONLY (orders are in) .                                         BP 116/68 (BP Location: Left Arm, Patient Position: Sitting)   Pulse 70   Wt 239  lb (108.4 kg)   SpO2 97%   BMI 37.43 kg/m   Current Meds  Medication Sig  . norgestimate-ethinyl estradiol (SPRINTEC 28) 0.25-35 MG-MCG tablet Take 1 tablet by mouth daily.    No results found for this or any previous visit (from the past 72 hour(s)).  No results found.     All questions at time of visit were answered - patient instructed to contact office with any additional concerns or updates.  ER/RTC precautions were reviewed with the patient as applicable.   Please note: voice recognition software was used to produce this document, and typos may escape review. Please contact Dr. Sheppard Coil for any needed clarifications.

## 2020-05-26 ENCOUNTER — Ambulatory Visit: Payer: 59 | Admitting: Dietician

## 2020-05-28 ENCOUNTER — Other Ambulatory Visit: Payer: Self-pay

## 2020-05-28 ENCOUNTER — Emergency Department (INDEPENDENT_AMBULATORY_CARE_PROVIDER_SITE_OTHER)
Admission: RE | Admit: 2020-05-28 | Discharge: 2020-05-28 | Disposition: A | Discharge status: Home / Self Care | Payer: 59 | Source: Ambulatory Visit

## 2020-05-28 VITALS — BP 123/83 | HR 74 | Temp 98.4°F | Resp 15

## 2020-05-28 DIAGNOSIS — E7800 Pure hypercholesterolemia, unspecified: Secondary | ICD-10-CM | POA: Insufficient documentation

## 2020-05-28 DIAGNOSIS — Z20818 Contact with and (suspected) exposure to other bacterial communicable diseases: Secondary | ICD-10-CM

## 2020-05-28 DIAGNOSIS — J029 Acute pharyngitis, unspecified: Secondary | ICD-10-CM | POA: Diagnosis not present

## 2020-05-28 LAB — POCT RAPID STREP A (OFFICE): Rapid Strep A Screen: NEGATIVE

## 2020-05-28 NOTE — ED Provider Notes (Signed)
[name]    CSN: [id number] Arrival date & time: 05/28/20  1311      History   Chief Complaint Chief Complaint  Patient presents with  . Appointment  . Sore Throat    HPI Mary Rocha is a 44 y.o. female.   HPI  Mary Rocha is a 44 y.o. female presenting to UC with c/o sore throat that started yesterday. Worse with swallowing, 3/10. Her 6yo daughter was diagnosed with strep throat last week. Pt denies cough, congestion, fever, chills, n/v/d.    Past Medical History:  Diagnosis Date  . Allergy   . AMA (advanced maternal age) multigravida 21+   . Gestational diabetes   . Headache(784.0)   . History of cholecystectomy   . History of gestational diabetes 08/04/2018  . Newborn product of IVF pregnancy   . Ovarian cyst     Patient Active Problem List   Diagnosis Date Noted  . Hypercholesterolemia 05/28/2020  . Hemorrhoids 08/26/2019  . Benign nevus 08/04/2018  . Irregular periods 08/04/2018  . Family history of early menopause 08/04/2018  . History of gestational diabetes 08/04/2018  . S/P cesarean section 11/02/2013    Class: Status post  . Infertility, female 09/10/2012    Past Surgical History:  Procedure Laterality Date  . CESAREAN SECTION N/A 11/02/2013   Procedure: CESAREAN SECTION;  Surgeon: Darlyn Chamber, MD;  Location: [facility];  Service: Obstetrics;  Laterality: N/A;  . CHOLECYSTECTOMY    . TONSILLECTOMY      OB History    Gravida  2   Para  1   Term  1   Preterm      AB  1   Living  1     SAB  1   TAB      Ectopic      Multiple      Live Births  1            Home Medications    Prior to Admission medications   Medication Sig Start Date End Date Taking? Authorizing Provider  norgestimate-ethinyl estradiol (SPRINTEC 28) 0.25-35 MG-MCG tablet Take 1 tablet by mouth daily.   Yes [provider]  simvastatin (ZOCOR) 20 MG tablet Take 1 tablet (20 mg total) by mouth daily. 05/04/20  Yes  Emeterio Reeve, DO    Family History Family History  Problem Relation Age of Onset  . Diabetes Maternal Aunt   . Thyroid disease Maternal Grandmother   . Pancreatic cancer Maternal Grandmother   . Cancer Maternal Grandfather        lymphoma  . Heart disease Paternal Grandfather   . Cancer Paternal Grandfather        pancreatic  . Pancreatic cancer Paternal Grandmother   . Pancreatic cancer Paternal Aunt   . Asthma Mother   . High Cholesterol Father   . High Cholesterol Sister     Social History Social History   Tobacco Use  . Smoking status: Never Smoker  . Smokeless tobacco: Never Used  Vaping Use  . Vaping Use: Never used  Substance Use Topics  . Alcohol use: Yes    Comment: 1 - 2 drinks / month  . Drug use: Never     Allergies   Patient has no known allergies.   Review of Systems Review of Systems  Constitutional: Negative for chills and fever.  HENT: Positive for sore throat. Negative for congestion, ear pain, trouble swallowing and voice change.   Respiratory: Negative  for cough and shortness of breath.   Cardiovascular: Negative for chest pain and palpitations.  Gastrointestinal: Negative for abdominal pain, diarrhea, nausea and vomiting.  Musculoskeletal: Negative for arthralgias, back pain and myalgias.  Skin: Negative for rash.  Neurological: Negative for dizziness, light-headedness and headaches.  All other systems reviewed and are negative.    Physical Exam Triage Vital Signs ED Triage Vitals  Enc Vitals Group     BP 05/28/20 1318 123/83     Pulse Rate 05/28/20 1318 74     Resp 05/28/20 1318 15     Temp 05/28/20 1318 98.4 F (36.9 C)     Temp Source 05/28/20 1318 Oral     SpO2 05/28/20 1318 98 %     Weight --      Height --      Head Circumference --      Peak Flow --      Pain Score 05/28/20 1325 2     Pain Loc --      Pain Edu? --      Excl. in [place]? --    No data found.  Updated Vital Signs BP 123/83 (BP Location: Right Arm)    Pulse 74   Temp 98.4 F (36.9 C) (Oral)   Resp 15   LMP 04/04/2020   SpO2 98%   Visual Acuity Right Eye Distance:   Left Eye Distance:   Bilateral Distance:    Right Eye Near:   Left Eye Near:    Bilateral Near:     Physical Exam Vitals and nursing note reviewed.  Constitutional:      General: She is not in acute distress.    Appearance: She is well-developed. She is not ill-appearing, toxic-appearing or diaphoretic.  HENT:     Head: Normocephalic and atraumatic.     Right Ear: Tympanic membrane and ear canal normal.     Left Ear: Tympanic membrane and ear canal normal.     Nose: Nose normal.     Right Sinus: No maxillary sinus tenderness or frontal sinus tenderness.     Left Sinus: No maxillary sinus tenderness or frontal sinus tenderness.     Mouth/Throat:     Lips: Pink.     Mouth: Mucous membranes are moist.     Pharynx: Oropharynx is clear. Uvula midline. Posterior oropharyngeal erythema present. No pharyngeal swelling, oropharyngeal exudate or uvula swelling.     Tonsils: No tonsillar exudate or tonsillar abscesses.  Cardiovascular:     Rate and Rhythm: Normal rate and regular rhythm.  Pulmonary:     Effort: Pulmonary effort is normal. No respiratory distress.     Breath sounds: Normal breath sounds. No stridor. No wheezing, rhonchi or rales.  Musculoskeletal:        General: Normal range of motion.     Cervical back: Normal range of motion and neck supple.  Lymphadenopathy:     Cervical: No cervical adenopathy.  Skin:    General: Skin is warm and dry.  Neurological:     Mental Status: She is alert and oriented to person, place, and time.  Psychiatric:        Behavior: Behavior normal.      UC Treatments / Results  Labs (all labs ordered are listed, but only abnormal results are displayed) Labs Reviewed  POCT RAPID STREP A (OFFICE)    EKG   Radiology No results found.  Procedures Procedures (including critical care time)  Medications  Ordered in UC Medications - No data  to display  Initial Impression / Assessment and Plan / UC Course  I have reviewed the triage vital signs and the nursing notes.  Pertinent labs & imaging results that were available during my care of the patient were reviewed by me and considered in my medical decision making (see chart for details).     Rapid strep: NEGATIVE Encouraged symptomatic tx F/u with PCP as needed  Final Clinical Impressions(s) / UC Diagnoses   Final diagnoses:  Pharyngitis, unspecified etiology  Exposure to strep throat     Discharge Instructions      You may take 500mg  acetaminophen every 4-6 hours or in combination with ibuprofen 400-600mg  every 6-8 hours as needed for pain, inflammation, and fever.  Be sure to well hydrated with clear liquids and get at least 8 hours of sleep at night, preferably more while sick.   Please follow up with family medicine in 1 week if needed.     ED Prescriptions    None     PDMP not reviewed this encounter.   Noe Gens, [place] 05/28/20 1512

## 2020-05-28 NOTE — Discharge Instructions (Signed)
  You may take 500mg acetaminophen every 4-6 hours or in combination with ibuprofen 400-600mg every 6-8 hours as needed for pain, inflammation, and fever.  Be sure to well hydrated with clear liquids and get at least 8 hours of sleep at night, preferably more while sick.   Please follow up with family medicine in 1 week if needed.   

## 2020-05-28 NOTE — ED Triage Notes (Signed)
Sore throat x 24 hours, but felt like she had mucus drainage at the back of her throat on Friday Denies fever or nasal drainage  Daughter was diagnosed w/ strep last week Moderna vaccine 09/2019

## 2020-06-05 ENCOUNTER — Telehealth (INDEPENDENT_AMBULATORY_CARE_PROVIDER_SITE_OTHER): Payer: 59 | Admitting: Osteopathic Medicine

## 2020-06-05 DIAGNOSIS — Z0289 Encounter for other administrative examinations: Secondary | ICD-10-CM

## 2020-06-08 ENCOUNTER — Ambulatory Visit: Payer: 59 | Admitting: Dietician

## 2020-06-08 NOTE — Progress Notes (Signed)
Late cancellation less than 24 hours, patient states improvement in symptoms

## 2020-11-13 ENCOUNTER — Telehealth: Payer: Self-pay | Admitting: General Practice

## 2020-11-13 NOTE — Telephone Encounter (Signed)
Transition Care Management Follow-up Telephone Call  Date of discharge and from where: 11/12/20 [facility]  How have you been since you were released from the hospital? Her eye still hurts; she did take today off from work.  Any questions or concerns? No  Items Reviewed:  Did the pt receive and understand the discharge instructions provided? Yes   Medications obtained and verified? Yes   Other? No   Any new allergies since your discharge? No   Dietary orders reviewed? No  Do you have support at home? Yes   Home Care and Equipment/Supplies: Were home health services ordered? no If so, what is the name of the agency?   Has the agency set up a time to come to the patient's home? no Were any new equipment or medical supplies ordered?  No What is the name of the medical supply agency? No Were you able to get the supplies/equipment?  Do you have any questions related to the use of the equipment or supplies? No  Functional Questionnaire: (I = Independent and D = Dependent) ADLs: I  Bathing/Dressing- I  Meal Prep- I   Eating- I  Maintaining continence- I  Transferring/Ambulation- I  Managing Meds- I  Follow up appointments reviewed:   PCP Hospital f/u appt confirmed? Yes  Scheduled to see Dr. Eber Jones on 11/17/20 @ 0830.  [facility] f/u appt confirmed? She stated that she will call to schedule.  Are transportation arrangements needed? No   If their condition worsens, is the pt aware to call PCP or go to the Emergency Dept.? Yes  Was the patient provided with contact information for the PCP's office or ED? Yes  Was to pt encouraged to call back with questions or concerns? Yes

## 2020-11-17 ENCOUNTER — Encounter: Payer: Self-pay | Admitting: Osteopathic Medicine

## 2020-11-17 ENCOUNTER — Ambulatory Visit: Payer: 59 | Admitting: Osteopathic Medicine

## 2020-11-17 ENCOUNTER — Other Ambulatory Visit: Payer: Self-pay

## 2020-11-17 VITALS — BP 118/78 | HR 65 | Temp 98.0°F | Wt 251.0 lb

## 2020-11-17 DIAGNOSIS — H1131 Conjunctival hemorrhage, right eye: Secondary | ICD-10-CM | POA: Diagnosis not present

## 2020-11-17 NOTE — Progress Notes (Signed)
Mary Rocha is a 45 y.o. female who presents to  [place] at [facility]  today, 11/17/20, seeking care for the following:  . ER follow up - eye injury. ED notes reviewed - dx corneal abrasion but eye exam notes below do not documentation of corneal issue.    Right eye: chemosis. no drainage, no exudate, no stye and no eyelid droop. No foreign body present. no conjunctival injection. Left eye: no drainage. no conjunctival injection. No scleral icterus. Right eye exhibits normal extraocular motion and no nystagmus. Left eye exhibits normal extraocular motion and no nystagmus. Photophobia. Abrasion noted to the medial aspect of the eye with conjunctival hemorrhaging present. Patient has no evidence of hyphema at this time.    Today pt reports doing better, still some photosensitivity but wearing sunglasses helps. She has appt w/ eye doctor in a few weeks for routine check-up. On exam, still some conjunctival hemorrhage medial lower R eye, mild yellowing in that area c/w heme breakdown. EOMI, PERRL,    ASSESSMENT & PLAN with other pertinent findings:  The encounter diagnosis was Conjunctival hemorrhage of right eye. Healing well. No complications. Monitor for now and let us or ophtho know if worse      See below for relevant physical exam findings  See below for recent lab and imaging results reviewed  Medications, allergies, PMH, PSH, SocH, [place] reviewed below    Follow-up instructions: Return if symptoms worsen or fail to improve.                                        Exam:  There were no vitals taken for this visit.  Constitutional: VS see above. General Appearance: alert, well-developed, well-nourished, NAD  Neck: No masses, trachea midline.   Respiratory: Normal respiratory effort.   Neurological: Normal balance/coordination. No tremor.  Skin: warm, dry, intact.   Psychiatric: Normal  judgment/insight. Normal mood and affect. Oriented x3.   No outpatient medications have been marked as taking for the 11/17/20 encounter (Appointment) with Emeterio Reeve, DO.    No Known Allergies  Patient Active Problem List   Diagnosis Date Noted  . Hypercholesterolemia 05/28/2020  . Hemorrhoids 08/26/2019  . Benign nevus 08/04/2018  . Irregular periods 08/04/2018  . Family history of early menopause 08/04/2018  . History of gestational diabetes 08/04/2018  . S/P cesarean section 11/02/2013    Class: Status post  . Infertility, female 09/10/2012    Family History  Problem Relation Age of Onset  . Diabetes Maternal Aunt   . Thyroid disease Maternal Grandmother   . Pancreatic cancer Maternal Grandmother   . Cancer Maternal Grandfather        lymphoma  . Heart disease Paternal Grandfather   . Cancer Paternal Grandfather        pancreatic  . Pancreatic cancer Paternal Grandmother   . Pancreatic cancer Paternal Aunt   . Asthma Mother   . High Cholesterol Father   . High Cholesterol Sister     Social History   Tobacco Use  Smoking Status Never Smoker  Smokeless Tobacco Never Used    Past Surgical History:  Procedure Laterality Date  . CESAREAN SECTION N/A 11/02/2013   Procedure: CESAREAN SECTION;  Surgeon: Darlyn Chamber, MD;  Location: [facility];  Service: Obstetrics;  Laterality: N/A;  . CHOLECYSTECTOMY    . TONSILLECTOMY  Immunization History  Administered Date(s) Administered  . Influenza,inj,Quad PF,6+ Mos 11/04/2013, 08/04/2018  . Influenza-Unspecified 06/08/2019  . Moderna Sars-Covid-2 Vaccination 09/18/2019, 10/19/2019, 07/25/2020  . Tdap 07/29/2013    No results found for this or any previous visit (from the past 2160 hour(s)).  No results found.     All questions at time of visit were answered - patient instructed to contact office with any additional concerns or updates. ER/RTC precautions were reviewed with the patient as applicable.    Please note: manual typing as well as voice recognition software may have been used to produce this document - typos may escape review. Please contact Dr. Sheppard Coil for any needed clarifications.

## 2020-12-06 ENCOUNTER — Ambulatory Visit (INDEPENDENT_AMBULATORY_CARE_PROVIDER_SITE_OTHER): Payer: 59 | Admitting: Family Medicine

## 2020-12-20 ENCOUNTER — Ambulatory Visit (INDEPENDENT_AMBULATORY_CARE_PROVIDER_SITE_OTHER): Payer: 59 | Admitting: Family Medicine

## 2021-01-03 ENCOUNTER — Ambulatory Visit (INDEPENDENT_AMBULATORY_CARE_PROVIDER_SITE_OTHER): Payer: 59 | Admitting: Family Medicine

## 2021-01-03 ENCOUNTER — Other Ambulatory Visit: Payer: Self-pay

## 2021-01-03 ENCOUNTER — Encounter (INDEPENDENT_AMBULATORY_CARE_PROVIDER_SITE_OTHER): Payer: Self-pay | Admitting: Family Medicine

## 2021-01-03 VITALS — BP 110/68 | HR 66 | Temp 98.6°F | Ht 67.0 in | Wt 244.0 lb

## 2021-01-03 DIAGNOSIS — E559 Vitamin D deficiency, unspecified: Secondary | ICD-10-CM | POA: Diagnosis not present

## 2021-01-03 DIAGNOSIS — R0602 Shortness of breath: Secondary | ICD-10-CM

## 2021-01-03 DIAGNOSIS — F3289 Other specified depressive episodes: Secondary | ICD-10-CM | POA: Diagnosis not present

## 2021-01-03 DIAGNOSIS — E7849 Other hyperlipidemia: Secondary | ICD-10-CM

## 2021-01-03 DIAGNOSIS — Z6838 Body mass index (BMI) 38.0-38.9, adult: Secondary | ICD-10-CM

## 2021-01-03 DIAGNOSIS — R5383 Other fatigue: Secondary | ICD-10-CM

## 2021-01-03 DIAGNOSIS — Z0289 Encounter for other administrative examinations: Secondary | ICD-10-CM

## 2021-01-03 DIAGNOSIS — Z9189 Other specified personal risk factors, not elsewhere classified: Secondary | ICD-10-CM | POA: Diagnosis not present

## 2021-01-03 DIAGNOSIS — Z8632 Personal history of gestational diabetes: Secondary | ICD-10-CM

## 2021-01-03 DIAGNOSIS — R4589 Other symptoms and signs involving emotional state: Secondary | ICD-10-CM

## 2021-01-03 NOTE — Progress Notes (Signed)
Office: [phone]  /  Fax: [phone]    Date: Jan 09, 2021   Appointment Start Time: 12:07pm Duration: 43 minutes Provider: Glennie Isle, Psy.D. Type of Session: Intake for Individual Therapy  Location of Patient: Home Location of Provider: Provider's home (private office) Type of Contact: Telepsychological Visit via MyChart Video Visit  Informed Consent: This provider called Rip Harbour at 12:02pm as she did not present for the telepsychological appointment. She stated she just got home and was going to join. Assistance was provided. As such, today's appointment was initiated 7 minutes late. Prior to proceeding with today's appointment, two pieces of identifying information were obtained. In addition, Benay's physical location at the time of this appointment was obtained as well a phone number she could be reached at in the event of technical difficulties. Nijae and this provider participated in today's telepsychological service.   The provider's role was explained to [employer]. The provider reviewed and discussed issues of confidentiality, privacy, and limits therein (e.g., reporting obligations). In addition to verbal informed consent, written informed consent for psychological services was obtained prior to the initial appointment. Since the clinic is not a 24/7 crisis center, mental health emergency resources were shared and this  provider explained MyChart, e-mail, voicemail, and/or other messaging systems should be utilized only for non-emergency reasons. This provider also explained that information obtained during appointments will be placed in [facility] record and relevant information will be shared with other providers at [facility] for coordination of care. Lorri agreed information may be shared with other [facility] providers as needed for coordination of care and by signing the service agreement document, she provided written  consent for coordination of care. Prior to initiating telepsychological services, Margaruite completed an informed consent document, which included the development of a safety plan (i.e., an emergency contact and emergency resources) in the event of an emergency/crisis. Quinteria expressed understanding of the rationale of the safety plan. Avonell verbally acknowledged understanding she is ultimately responsible for understanding her insurance benefits for telepsychological and in-person services. This provider also reviewed confidentiality, as it relates to telepsychological services, as well as the rationale for telepsychological services (i.e., to reduce exposure risk to COVID-19). Aleigh  acknowledged understanding that appointments cannot be recorded without both party consent and she is aware she is responsible for securing confidentiality on her end of the session. Caryle verbally consented to proceed.  Chief Complaint/HPI: Renie was referred by Dr. Mellody Dance on Jan 03, 2021 during their initial appointment. Isatu's Food and Mood (modified PHQ-9) score on Jan 03, 2021 was 9.  During today's appointment, Miabella stated she does not think she is "abnormally depressed," but is experiencing some emotional eating behaviors which were identified during her appointment with Dr. Raliegh Scarlet. She was verbally administered a questionnaire assessing various behaviors related to emotional eating. Vera endorsed the following: experience food cravings on a regular basis, use food to help you cope with emotional situations, overeat when you are worried about something, overeat frequently when you are bored or lonely and overeat when you are alone, but eat much less when you are with other people. She shared she craves a bowl of dry cereal in the evenings and veggies/fruit during the day. Vernecia believes the onset of emotional eating was likely during her teenage years, noting her father would often make comments  about her eating habits (e.g., "Your mom needs to feed you better.") She described the current frequency of emotional eating as "few  times a week." In addition, Madisen denied a history of binge eating. Ahyanna denied a history of restricting food intake, purging and engagement in other compensatory strategies, and has never been diagnosed with an eating disorder. She also denied a history of treatment for emotional eating behaviors. Currently, Amisadai indicated boredom triggers emotional eating behaviors. In the past week, Chrysa stated she has is moving around during commercials and making better choices (e.g., drinking a cup tea). Furthermore, Shanya acknowledged she engages in all or nothing thinking at times.   Mental Status Examination:  Appearance: well groomed and appropriate hygiene  Behavior: appropriate to circumstances Mood: euthymic Affect: mood congruent Speech: normal in rate, volume, and tone Eye Contact: appropriate Psychomotor Activity: unable to assess  Gait: unable to assess Thought Process: linear, logical, and goal directed  Thought Content/Perception: denies suicidal and homicidal ideation, plan, and intent and no hallucinations, delusions, bizarre thinking or behavior reported or observed Orientation: time, person, place, and purpose of appointment Memory/Concentration: memory, attention, language, and fund of knowledge intact  Insight/Judgment: good  Family & Psychosocial History: Dashonda reported she is married and she has a daughter (age 22). 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 Additionally, Kearie shared her highest level of education obtained is a master's degree. Currently, Arminda's social support system consists of her husband, two good friends (sees them once a month for breakfast), another girlfriend (sees once a month, text regularly), and sister. Moreover, Jeremie stated she resides with her husband,  daughter, and dog.   Medical History:  Past Medical History:  Diagnosis Date  . Allergy   . AMA (advanced maternal age) multigravida 3+   . Anemia   . Back pain   . Gestational diabetes   . Headache(784.0)   . High cholesterol   . History of cholecystectomy   . History of gestational diabetes 08/04/2018  . Infertility, female   . Lactose intolerance   . Newborn product of IVF pregnancy   . Ovarian cyst   . Vitamin D deficiency    Past Surgical History:  Procedure Laterality Date  . CESAREAN SECTION N/A 11/02/2013   Procedure: CESAREAN SECTION;  Surgeon: Darlyn Chamber, MD;  Location: [facility];  Service: Obstetrics;  Laterality: N/A;  . CHOLECYSTECTOMY    . TONSILLECTOMY     Current Outpatient Medications on File Prior to Visit  Medication Sig Dispense Refill  . norgestimate-ethinyl estradiol (ORTHO-CYCLEN) 0.25-35 MG-MCG tablet Take 1 tablet by mouth daily. (Patient not taking: Reported on 01/03/2021)    . simvastatin (ZOCOR) 20 MG tablet Take 1 tablet (20 mg total) by mouth daily. 90 tablet 3   No current facility-administered medications on file prior to visit.   Mental Health History: Rosheena reported she attended marriage counseling around 2019. She denied a history of psychotropic medications. Elizabe reported there is no history of hospitalizations for psychiatric concerns. Anyelin stated her half-brother is diagnosed as "bipolar and schizophrenic," noting it comes from "his mom's side." She stated her father is a "raging alcoholic," noting he recently completed a 30-day treatment program. She described the relationship with her father as limited, adding they occasionally speak via phone. Atleigh reported there is no history of trauma including psychological, physical  and sexual abuse, as well as neglect.   Yaileen described her typical mood lately as "better," noting she "struggled in April" as it was when her grandmother passed away five years ago. Aside from concerns noted  above and endorsed on  the PHQ-9 and GAD-7, Raesha reported experiencing occasional decreased motivation and decreased self-image. Sumayyah  stated she consumes alcohol 3-4 times a year in the form of one standard drink. She denied tobacco use. She denied illicit/recreational substance use. Regarding caffeine intake, Tasneem reported consuming one cup of hot tea daily. Furthermore, Sylena indicated she is not experiencing the following: hallucinations and delusions, paranoia, symptoms of mania , social withdrawal, crying spells and panic attacks. She also denied history of and current suicidal ideation, plan, and intent; history of and current homicidal ideation, plan, and intent; and history of and current engagement in self-harm.  The following strengths were reported by [facility]: extremely patient, very easy going, team player, and open to help. The following strengths were observed by this provider: ability to express thoughts and feelings during the therapeutic session, ability to establish and benefit from a therapeutic relationship, willingness to work toward established goal(s) with the clinic and ability to engage in reciprocal conversation.   Legal History: Jerriyah reported there is no history of legal involvement.   Structured Assessments Results: The Patient Health Questionnaire-9 (PHQ-9) is a self-report measure that assesses symptoms and severity of depression over the course of the last two weeks. Siara obtained a score of 4 suggesting minimal depression. Alvira finds the endorsed symptoms to be somewhat difficult. [0= Not at all; 1= Several days; 2= More than half the days; 3= Nearly every day] Little interest or pleasure in doing things 0  Feeling down, depressed, or hopeless 0  Trouble falling or staying asleep, or sleeping too much 0  Feeling tired or having little energy 0  Poor appetite or overeating 1  Feeling bad about yourself --- or that you are a failure or have let yourself  or your family down- "physical appearance" 3  Trouble concentrating on things, such as reading the newspaper or watching television 0  Moving or speaking so slowly that other people could have noticed? Or the opposite --- being so fidgety or restless that you have been moving around a lot more than usual 0  Thoughts that you would be better off dead or hurting yourself in some way 0  PHQ-9 Score 4    The Generalized Anxiety Disorder-7 (GAD-7) is a brief self-report measure that assesses symptoms of anxiety over the course of the last two weeks. Lotta obtained a score of 0. [0= Not at all; 1= Several days; 2= Over half the days; 3= Nearly every day] Feeling nervous, anxious, on edge 0  Not being able to stop or control worrying 0  Worrying too much about different things 0  Trouble relaxing 0  Being so restless that it's hard to sit still 0  Becoming easily annoyed or irritable 0  Feeling afraid as if something awful might happen 0  GAD-7 Score 0   Interventions:  Conducted a chart review Focused on rapport building Verbally administered PHQ-9 and GAD-7 for symptom monitoring Verbally administered Food & Mood questionnaire to assess various behaviors related to emotional eating Provided emphatic reflections and validation Collaborated with patient on a treatment goal  Psychoeducation provided regarding physical versus emotional hunger  Provisional DSM-5 Diagnosis(es): F32.89 Other Specified Depressive Disorder, Emotional Eating Behaviors  Plan: Ary appears able and willing to participate as evidenced by collaboration on a treatment goal, engagement in reciprocal conversation, and asking questions as needed for clarification. The next appointment will be scheduled in three weeks, which will be via MyChart Video Visit. The following treatment goal was established: increase coping  skills. This provider will regularly review the treatment plan and medical chart to keep informed of status  changes. Lochlyn expressed understanding and agreement with the initial treatment plan of care. Iyah will be sent a handout via e-mail to utilize between now and the next appointment to increase awareness of hunger patterns and subsequent eating. Haylyn provided verbal consent during today's appointment for this provider to send the handout via e-mail.

## 2021-01-04 LAB — CBC WITH DIFFERENTIAL/PLATELET
Basophils Absolute: 0 10*3/uL (ref 0.0–0.2)
Basos: 1 %
EOS (ABSOLUTE): 0.2 10*3/uL (ref 0.0–0.4)
Eos: 2 %
Hematocrit: 45.3 % (ref 34.0–46.6)
Hemoglobin: 15.3 g/dL (ref 11.1–15.9)
Immature Grans (Abs): 0 10*3/uL (ref 0.0–0.1)
Immature Granulocytes: 1 %
Lymphocytes Absolute: 1.5 10*3/uL (ref 0.7–3.1)
Lymphs: 22 %
MCH: 30 pg (ref 26.6–33.0)
MCHC: 33.8 g/dL (ref 31.5–35.7)
MCV: 89 fL (ref 79–97)
Monocytes Absolute: 0.5 10*3/uL (ref 0.1–0.9)
Monocytes: 7 %
Neutrophils Absolute: 4.4 10*3/uL (ref 1.4–7.0)
Neutrophils: 67 %
Platelets: 211 10*3/uL (ref 150–450)
RBC: 5.1 x10E6/uL (ref 3.77–5.28)
RDW: 13.1 % (ref 11.7–15.4)
WBC: 6.5 10*3/uL (ref 3.4–10.8)

## 2021-01-04 LAB — T3: T3, Total: 149 ng/dL (ref 71–180)

## 2021-01-04 LAB — TSH: TSH: 1.02 u[IU]/mL (ref 0.450–4.500)

## 2021-01-04 LAB — COMPREHENSIVE METABOLIC PANEL
ALT: 26 IU/L (ref 0–32)
AST: 20 IU/L (ref 0–40)
Albumin/Globulin Ratio: 2 (ref 1.2–2.2)
Albumin: 4.2 g/dL (ref 3.8–4.8)
Alkaline Phosphatase: 110 IU/L (ref 44–121)
BUN/Creatinine Ratio: 10 (ref 9–23)
BUN: 7 mg/dL (ref 6–24)
Bilirubin Total: 0.7 mg/dL (ref 0.0–1.2)
CO2: 23 mmol/L (ref 20–29)
Calcium: 9 mg/dL (ref 8.7–10.2)
Chloride: 102 mmol/L (ref 96–106)
Creatinine, Ser: 0.67 mg/dL (ref 0.57–1.00)
Globulin, Total: 2.1 g/dL (ref 1.5–4.5)
Glucose: 90 mg/dL (ref 65–99)
Potassium: 4.2 mmol/L (ref 3.5–5.2)
Sodium: 141 mmol/L (ref 134–144)
Total Protein: 6.3 g/dL (ref 6.0–8.5)
eGFR: 110 mL/min/{1.73_m2} (ref >59)

## 2021-01-04 LAB — INSULIN, RANDOM: INSULIN: 9.7 u[IU]/mL (ref 2.6–24.9)

## 2021-01-04 LAB — LIPID PANEL
Chol/HDL Ratio: 3.6 ratio (ref 0.0–4.4)
Cholesterol, Total: 177 mg/dL (ref 100–199)
HDL: 49 mg/dL (ref >39)
LDL Chol Calc (NIH): 104 mg/dL — ABNORMAL HIGH (ref 0–99)
Triglycerides: 133 mg/dL (ref 0–149)
VLDL Cholesterol Cal: 24 mg/dL (ref 5–40)

## 2021-01-04 LAB — VITAMIN D 25 HYDROXY (VIT D DEFICIENCY, FRACTURES): Vit D, 25-Hydroxy: 18.2 ng/mL — ABNORMAL LOW (ref 30.0–100.0)

## 2021-01-04 LAB — FOLATE: Folate: 7.9 ng/mL (ref >3.0)

## 2021-01-04 LAB — T4, FREE: Free T4: 0.97 ng/dL (ref 0.82–1.77)

## 2021-01-04 LAB — VITAMIN B12: Vitamin B-12: 537 pg/mL (ref 232–1245)

## 2021-01-04 LAB — HEMOGLOBIN A1C
Est. average glucose Bld gHb Est-mCnc: 114 mg/dL
Hgb A1c MFr Bld: 5.6 % (ref 4.8–5.6)

## 2021-01-08 ENCOUNTER — Telehealth (INDEPENDENT_AMBULATORY_CARE_PROVIDER_SITE_OTHER): Payer: 59 | Admitting: Psychology

## 2021-01-08 DIAGNOSIS — F3289 Other specified depressive episodes: Secondary | ICD-10-CM

## 2021-01-11 NOTE — Progress Notes (Signed)
Chief Complaint:   Mary Rocha (MR# [id number]) is a 45 y.o. female who presents for evaluation and treatment of obesity and related comorbidities. Current BMI is Body mass index is 38.22 kg/m. Mary Rocha has been struggling with her weight for many years and has been unsuccessful in either losing weight, maintaining weight loss, or reaching her healthy weight goal.  Mary Rocha is currently in the action stage of change and ready to dedicate time achieving and maintaining a healthier weight. Mary Rocha is interested in becoming our patient and working on intensive lifestyle modifications including (but not limited to) diet and exercise for weight loss.  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  Lives with her husband, Mary Rocha, and 50-year-old daughter.  She craves sugar, especially after dinner.  Snacks at night on cereal and cookies.  Worst habits are snacking and hiding bad habits.  Mary Rocha's habits were reviewed today and are as follows: Her family eats meals together, she thinks her family will eat healthier with her, her desired weight loss is 86 pounds, she started gaining excessive weight within the past 3 years, her heaviest weight ever was 251 pounds, she has significant food cravings issues, she snacks frequently in the evenings, she frequently makes poor food choices, she frequently eats larger portions than normal and she struggles with emotional eating.  Depression Screen Mary Rocha's Food and Mood (modified PHQ-9) score was 9.  Depression screen PHQ 2/9 01/03/2021  Decreased Interest 2  Down, Depressed, Hopeless 1  PHQ - 2 Score 3  Altered sleeping 1  Tired, decreased energy 2  Change in appetite 1  Feeling bad or failure about yourself  2  Trouble concentrating 0  Moving slowly or fidgety/restless 0  Suicidal thoughts 0  PHQ-9 Score 9  Difficult doing work/chores Not difficult at all   Assessment/Plan:   Orders Placed This Encounter   Procedures  . Vitamin B12  . CBC with Differential/Platelet  . Folate  . Comprehensive metabolic panel  . Hemoglobin A1c  . Insulin, random  . Lipid panel  . T3  . T4, free  . TSH  . VITAMIN D 25 Hydroxy (Vit-D Deficiency, Fractures)  . EKG 12-Lead   1. Other fatigue Mary Rocha admits to daytime somnolence and reports waking up still tired. Patent has a history of symptoms of daytime fatigue and morning fatigue. Mary Rocha generally gets 6 or 7 hours of sleep per night, and states that she has generally restful sleep. Snoring is not present. Apneic episodes are not present. Epworth Sleepiness Score is 4.  ECG - NSR with no S-T or T-wave abnormalities.  Borderline low voltage.  Mary Rocha does feel that her weight is causing her energy to be lower than it should be. Fatigue may be related to obesity, depression or many other causes. Labs will be ordered, and in the meanwhile, [name] will focus on self care including making healthy food choices, increasing physical activity and focusing on stress reduction.  Check ECG and labs today.  - EKG 12-Lead - Vitamin B12 - CBC with Differential/Platelet - Folate - Comprehensive metabolic panel - T3 - T4, free - TSH  2. SOBOE (shortness of breath on exertion) Mary Rocha notes increasing shortness of breath with exercising and seems to be worsening over time with weight gain. She notes getting out of breath sooner with activity than she used to. This has gotten worse recently. Mary Rocha denies shortness of breath at rest or orthopnea.  [name] does feel that she  gets out of breath more easily that she used to when she exercises. Mary Rocha's shortness of breath appears to be obesity related and exercise induced. She has agreed to work on weight loss and gradually increase exercise to treat her exercise induced shortness of breath. Will continue to monitor closely.  Check IC and labs today.  - CBC with Differential/Platelet - Comprehensive metabolic  panel  3. Other hyperlipidemia Course: Uncontrolled.  Lipid-lowering medications: Zocor 20 mg daily.  Last LDL 188.   Plan: Dietary changes: Increase soluble fiber, decrease simple carbohydrates, decrease saturated fat. Exercise changes: Moderate to vigorous-intensity aerobic activity 150 minutes per week or as tolerated. We will continue to monitor along with PCP/specialists as it pertains to her weight loss journey. Will check labs today.  Not at goal at last check.  Lab Results  Component Value Date   CHOL 177 01/03/2021   HDL 49 01/03/2021   LDLCALC 104 (H) 01/03/2021   TRIG 133 01/03/2021   CHOLHDL 3.6 01/03/2021   Lab Results  Component Value Date   ALT 26 01/03/2021   AST 20 01/03/2021   ALKPHOS 110 01/03/2021   BILITOT 0.7 01/03/2021   The 10-year ASCVD risk score Mary Rocha., et al., 2013) is: 0.5%   Values used to calculate the score:     Age: 34 years     Sex: Female     Is Non-Hispanic African American: No     Diabetic: No     Tobacco smoker: No     Systolic Blood Pressure: 952 mmHg     Is BP treated: No     HDL Cholesterol: 49 mg/dL     Total Cholesterol: 177 mg/dL  - Lipid panel  4. Vitamin D deficiency Optimal goal > 50 ng/dL.  She used to be on a weekly supplement. No on daily OTC vitamin D 1,000 IU daily.  Plan:  Will check vitamin D level today, as per below.  Continue OTC for now.  Education done.  - VITAMIN D 25 Hydroxy (Vit-D Deficiency, Fractures)  5. History of gestational diabetes No history of prediabetes.  Last A1c within normal limits.  Around 8 months ago, A1c was 5.3.  Plan:  Check labs.  Follow prudent nutritional plan and weight loss.  - Comprehensive metabolic panel - Hemoglobin A1c - Insulin, random  6. Other depression, with emotional eating Not at goal. Medication: None.  She denies depression , however, endorses emotional eating habits, which may be a challenge to her weight loss success.  Plan:  Handouts provided.   Strategies discussed.  Behavior modification techniques were discussed today to help deal with emotional/non-hunger eating behaviors. Patient was referred to Dr. Mallie Mussel, our Bariatric Psychologist, for evaluation due to her elevated PHQ-9 score and significant struggles with emotional eating.  7. At risk for diabetes mellitus - Kathrynn was given diabetes prevention education and counseling today of more than 22 minutes.  - Counseled patient on pathophysiology of disease and meaning/ implication of lab results.  - Reviewed how certain foods can either stimulate or inhibit insulin release, and subsequently affect hunger pathways  - Importance of following a healthy meal plan with limiting amounts of simple carbohydrates discussed with patient - Effects of regular aerobic exercise on blood sugar regulation reviewed and encouraged an eventual goal of 30 min 5d/week or more as a minimum.  - Briefly discussed treatment options, which always include dietary and lifestyle modification as first line.   - Handouts provided at patient's desire and/or told  to go online to the American Diabetes Association website for further information.  8. Class 2 severe obesity with serious comorbidity and body mass index (BMI) of 38.0 to 38.9 in adult, unspecified obesity type (HCC)  Mary Rocha is currently in the action stage of change and her goal is to continue with weight loss efforts. I recommend Mary Rocha begin the structured treatment plan as follows:  She has agreed to the Category 1 Plan.  Exercise goals: As is.   Behavioral modification strategies: increasing lean protein intake, decreasing simple carbohydrates, meal planning and cooking strategies and planning for success.  She was informed of the importance of frequent follow-up visits to maximize her success with intensive lifestyle modifications for her multiple health conditions. She was informed we would discuss her lab results at her next visit unless there  is a critical issue that needs to be addressed sooner. Mary Rocha agreed to keep her next visit at the agreed upon time to discuss these results.  Objective:   Blood pressure 110/68, pulse 66, temperature 98.6 F (37 C), height 5\' 7"  (1.702 m), weight 244 lb (110.7 kg), SpO2 99 %. Body mass index is 38.22 kg/m.  EKG: Normal sinus rhythm, rate 68 bpm.  Indirect Calorimeter completed today shows a VO2 of 207 and a REE of 1440.  Her calculated basal metabolic rate is 5993 thus her basal metabolic rate is worse than expected.  General: Cooperative, alert, well developed, in no acute distress. HEENT: Conjunctivae and lids unremarkable. Cardiovascular: Regular rhythm.  Lungs: Normal work of breathing. Neurologic: No focal deficits.   Lab Results  Component Value Date   CREATININE 0.67 01/03/2021   BUN 7 01/03/2021   NA 141 01/03/2021   K 4.2 01/03/2021   CL 102 01/03/2021   CO2 23 01/03/2021   Lab Results  Component Value Date   ALT 26 01/03/2021   AST 20 01/03/2021   ALKPHOS 110 01/03/2021   BILITOT 0.7 01/03/2021   Lab Results  Component Value Date   HGBA1C 5.6 01/03/2021   HGBA1C 5.3 04/26/2020   HGBA1C 5.0 08/13/2018   Lab Results  Component Value Date   INSULIN 9.7 01/03/2021   Lab Results  Component Value Date   TSH 1.020 01/03/2021   Lab Results  Component Value Date   CHOL 177 01/03/2021   HDL 49 01/03/2021   LDLCALC 104 (H) 01/03/2021   TRIG 133 01/03/2021   CHOLHDL 3.6 01/03/2021   Lab Results  Component Value Date   WBC 6.5 01/03/2021   HGB 15.3 01/03/2021   HCT 45.3 01/03/2021   MCV 89 01/03/2021   PLT 211 01/03/2021   Attestation Statements:   This is the patient's first visit at [facility]. The patient's NEW PATIENT PACKET was reviewed at length. Included in the packet: current and past health history, medications, allergies, ROS, gynecologic history (women only), surgical history, family history, social history, weight history,  weight loss surgery history (for those that have had weight loss surgery), nutritional evaluation, mood and food questionnaire, PHQ9, Epworth questionnaire, sleep habits questionnaire, patient life and health improvement goals questionnaire. These will all be scanned into the patient's chart under media.   During the visit, I independently reviewed the patient's EKG, bioimpedance scale results, and indirect calorimeter results. I used this information to tailor a meal plan for the patient that will help her to lose weight and will improve her obesity-related conditions going forward. I performed a medically necessary appropriate examination and/or evaluation. I discussed the  assessment and treatment plan with the patient. The patient was provided an opportunity to ask questions and all were answered. The patient agreed with the plan and demonstrated an understanding of the instructions. Labs were ordered at this visit and will be reviewed at the next visit unless more critical results need to be addressed immediately. Clinical information was updated and documented in the EMR.   I, Water quality scientist, CMA, am acting as Location manager for [employer], DO.  I have reviewed the above documentation for accuracy and completeness, and I agree with the above. Marjory Sneddon, D.O.  The [place] was signed into law in 2016 which includes the topic of electronic health records.  This provides immediate access to information in MyChart.  This includes consultation notes, operative notes, office notes, lab results and pathology reports.  If you have any questions about what you read please let us know at your next visit so we can discuss your concerns and take corrective action if need be.  We are right here with you.

## 2021-01-15 NOTE — Progress Notes (Signed)
  Office: [phone]  /  Fax: [phone]    Date: January 29, 2021   Appointment Start Time: 12:35pm Duration: 26 minutes Provider: Glennie Isle, Psy.D. Type of Session: Individual Therapy  Location of Patient: Home Location of Provider: Provider's home (private office) Type of Contact: Telepsychological Visit via MyChart Video Visit  Session Content: This provider called Mary Rocha at 12:32pm as she did not present for the telepsychological appointment. Assistance on connecting was provided. As such, today's appointment was initiated 5 minutes late. Mary Rocha is a 45 y.o. female presenting for a follow-up appointment to address the previously established treatment goal of increasing coping skills. Today's appointment was a telepsychological visit due to COVID-19. Mary Rocha provided verbal consent for today's telepsychological appointment and she is aware she is responsible for securing confidentiality on her end of the session. Prior to proceeding with today's appointment, Mary Rocha's physical location at the time of this appointment was obtained as well a phone number she could be reached at in the event of technical difficulties. Mary Rocha and this provider participated in today's telepsychological service. Of note, today's appointment was switched to a regular telephone call with Mary Rocha verbal consent at 12:48pm due to technical issues.  This provider conducted a brief check-in. Maylea stated she is in the process of purchasing a car, adding it has been stressful. Regarding eating, she shared she feels she is "making progress." She described a reduction in emotional eating, adding if she does engage in emotional eating behaviors she is making better choices. Positive reinforcement was provided. Psychoeducation regarding triggers for emotional eating was provided. Mary Rocha was provided a handout, and encouraged to utilize the handout between now and the next appointment to increase awareness of triggers  and frequency. Mary Rocha agreed. This provider also discussed behavioral strategies for specific triggers, such as placing the utensil down when conversing to avoid mindless eating. Mary Rocha provided verbal consent during today's appointment for this provider to send a handout about triggers via e-mail. Mary Rocha was receptive to today's appointment as evidenced by openness to sharing, responsiveness to feedback, and willingness to explore triggers for emotional eating.  Mental Status Examination:  Appearance: well groomed and appropriate hygiene  Behavior: appropriate to circumstances Mood: euthymic Affect: mood congruent Speech: normal in rate, volume, and tone Eye Contact: appropriate Psychomotor Activity: unable to assess  Gait: unable to assess Thought Process: linear, logical, and goal directed  Thought Content/Perception: no hallucinations, delusions, bizarre thinking or behavior reported or observed and no evidence or endorsement of suicidal and homicidal ideation, plan, and intent Orientation: time, person, place, and purpose of appointment Memory/Concentration: memory, attention, language, and fund of knowledge intact  Insight/Judgment: good  Interventions:  Conducted a brief chart review Provided empathic reflections and validation Employed supportive psychotherapy interventions to facilitate reduced distress and to improve coping skills with identified stressors Psychoeducation provided regarding triggers for emotional eating  Provided positive reinforcement   DSM-5 Diagnosis(es): F32.89 Other Specified Depressive Disorder, Emotional Eating Behaviors  Treatment Goal & Progress: During the initial appointment with this provider, the following treatment goal was established: increase coping skills. Jinny has demonstrated progress in her goal as evidenced by increased awareness of hunger patterns and reduction in emotional eating.   Plan: The next appointment will be scheduled in  approximately two weeks, which will be via MyChart Video Visit. The next session will focus on working towards the established treatment goal.

## 2021-01-17 ENCOUNTER — Other Ambulatory Visit: Payer: Self-pay

## 2021-01-17 ENCOUNTER — Ambulatory Visit (INDEPENDENT_AMBULATORY_CARE_PROVIDER_SITE_OTHER): Payer: 59 | Admitting: Family Medicine

## 2021-01-17 ENCOUNTER — Encounter (INDEPENDENT_AMBULATORY_CARE_PROVIDER_SITE_OTHER): Payer: Self-pay | Admitting: Family Medicine

## 2021-01-17 VITALS — BP 112/73 | HR 60 | Temp 98.2°F | Ht 67.0 in | Wt 243.0 lb

## 2021-01-17 DIAGNOSIS — E559 Vitamin D deficiency, unspecified: Secondary | ICD-10-CM | POA: Diagnosis not present

## 2021-01-17 DIAGNOSIS — E8881 Metabolic syndrome: Secondary | ICD-10-CM | POA: Diagnosis not present

## 2021-01-17 DIAGNOSIS — E7849 Other hyperlipidemia: Secondary | ICD-10-CM | POA: Diagnosis not present

## 2021-01-17 DIAGNOSIS — Z6838 Body mass index (BMI) 38.0-38.9, adult: Secondary | ICD-10-CM

## 2021-01-17 DIAGNOSIS — F32A Depression, unspecified: Secondary | ICD-10-CM | POA: Insufficient documentation

## 2021-01-17 DIAGNOSIS — Z9189 Other specified personal risk factors, not elsewhere classified: Secondary | ICD-10-CM | POA: Diagnosis not present

## 2021-01-17 DIAGNOSIS — F3289 Other specified depressive episodes: Secondary | ICD-10-CM | POA: Diagnosis not present

## 2021-01-17 MED ORDER — VITAMIN D (ERGOCALCIFEROL) 1.25 MG (50000 UNIT) PO CAPS: 50000.0000 [IU] | ORAL_CAPSULE | ORAL | 0 refills | Status: DC

## 2021-01-24 NOTE — Progress Notes (Signed)
Chief Complaint:   OBESITY Mary Rocha is here to discuss her progress with her obesity treatment plan along with follow-up of her obesity related diagnoses.   Today's visit was #: 2 Starting weight: 244 lbs Starting date: 01/03/2021 Today's weight: 243 lbs Today's date: 01/17/2021 Weight change since last visit: 1 lb Total lbs lost to date: 1 lb Body mass index is 38.06 kg/m.  Total weight loss percentage to date: -0.41%  Interim History:  Mary Rocha is here today for her first follow-up office visit since starting the program with [place].  All blood work/ lab tests that were recently ordered by myself or an outside provider were reviewed with patient today per their request.   Extended time was spent counseling her on all new disease processes that were discovered or preexisting ones that are worsening.  she understands that many of these abnormalities will need to monitored regularly along with the current treatment plan of prudent dietary changes, in which we are making each and every office visit, to improve these health parameters.  We reviewed her new meal plan in detail and questions were answered.  Patient's food recall appears to be accurate and consistent with what is on plan when she is following it.   When eating on plan, her hunger and cravings are well controlled.    For snacks, rolled up [place] breast and noticed some of her hunger and cravings were emotionally mediated and habitual.  She spent 40% of the time off plan with family in town.  Current Meal Plan: the Category 1 Plan for 60% of the time.  Current Exercise Plan: Walking/yoga for 20-45 minutes 3 times per week.  Assessment/Plan:   Meds ordered this encounter  Medications   DISCONTD: Vitamin D, Ergocalciferol, (DRISDOL) 1.25 MG (50000 UNIT) CAPS capsule    Sig: Take 1 capsule (50,000 Units total) by mouth every 7 (seven) days.    Dispense:  4 capsule    Refill:  0    1. Other hyperlipidemia Course:  Not at goal. Lipid-lowering medications: Zocor 20 mg daily.  LDL was 181 ~8 months ago.  Now 104. IMproving  Plan:  Discussed labs with patient today.  Dietary changes: Increase soluble fiber, decrease simple carbohydrates, decrease saturated fat. Exercise changes: Moderate to vigorous-intensity aerobic activity 150 minutes per week or as tolerated. We will continue to monitor along with PCP/specialists as it pertains to her weight loss journey.  Lab Results  Component Value Date   CHOL 177 01/03/2021   HDL 49 01/03/2021   LDLCALC 104 (H) 01/03/2021   TRIG 133 01/03/2021   CHOLHDL 3.6 01/03/2021   Lab Results  Component Value Date   ALT 26 01/03/2021   AST 20 01/03/2021   ALKPHOS 110 01/03/2021   BILITOT 0.7 01/03/2021   The 10-year ASCVD risk score [name], et al., 2013) is: 0.5%   Values used to calculate the score:     Age: 45 years     Sex: Female     Is Non-Hispanic African American: No     Diabetic: No     Tobacco smoker: No     Systolic Blood Pressure: 761 mmHg     Is BP treated: No     HDL Cholesterol: 49 mg/dL     Total Cholesterol: 177 mg/dL  2. Insulin resistance Not at goal. Goal is HgbA1c < 5.7, fasting insulin closer to 5.  Medication: None.  History of gestational diabetes. History of elevated fasting  glucose.  Plan:  New.  Discussed labs with patient today.  She will continue to focus on protein-rich, low simple carbohydrate foods. We reviewed the importance of hydration, regular exercise for stress reduction, and restorative sleep.  Consider medications in the future as needed.  Lab Results  Component Value Date   HGBA1C 5.6 01/03/2021   Lab Results  Component Value Date   INSULIN 9.7 01/03/2021   3. Vitamin D deficiency Not at goal. Current vitamin D is 18.2, tested on 01/03/2021. Optimal goal > 50 ng/dL.   Plan:  New.  Discussed labs with patient today.  Continue to take prescription Vitamin D @50 ,000 IU every week as prescribed.  Follow-up for  routine testing of Vitamin D, at least 2-3 times per year to avoid over-replacement.  - Refill Vitamin D, Ergocalciferol, (DRISDOL) 1.25 MG (50000 UNIT) CAPS capsule; Take 1 capsule (50,000 Units total) by mouth every 7 (seven) days.  Dispense: 4 capsule; Refill: 0  4. Other depression, with emotional eating Improving, but not optimized. Medication: None.  Plan:  Discussed labs with patient today.  Thyroid labs within normal limits.  Continue with Dr. Mallie Mussel.  Has handouts from her on mindful eating.  Working out.  5. At risk for impaired metabolic function Due to Mary Rocha's current state of health and medical condition(s), she is at a significantly higher risk for impaired metabolic function.   At least 23 minutes was spent on counseling Mary Rocha about these concerns today.  This places the patient at a much greater risk to subsequently develop cardio-pulmonary conditions that can negatively affect the patient's quality of life.  I stressed the importance of reversing these risks factors.  The initial goal is to lose at least 5-10% of starting weight to help reduce risk factors.  Counseling:  Intensive lifestyle modifications discussed with Mary Rocha as the most appropriate first line treatment.  she will continue to work on diet, exercise, and weight loss efforts.  We will continue to reassess these conditions on a fairly regular basis in an attempt to decrease the patient's overall morbidity and mortality.  6. Obesity, current BMI 38.1  Course: Mary Rocha is currently in the action stage of change. As such, her goal is to continue with weight loss efforts.   Nutrition goals: She has agreed to the Category 1 Plan.   Exercise goals:  As is.  Behavioral modification strategies: ways to avoid boredom eating, emotional eating strategies and avoiding temptations.  Mary Rocha has agreed to follow-up with our clinic in 3 weeks. She was informed of the importance of frequent follow-up visits to maximize her  success with intensive lifestyle modifications for her multiple health conditions.   Objective:   Blood pressure 112/73, pulse 60, temperature 98.2 F (36.8 C), height 5\' 7"  (1.702 m), weight 243 lb (110.2 kg), SpO2 99 %. Body mass index is 38.06 kg/m.  General: Cooperative, alert, well developed, in no acute distress. HEENT: Conjunctivae and lids unremarkable. Cardiovascular: Regular rhythm.  Lungs: Normal work of breathing. Neurologic: No focal deficits.   Lab Results  Component Value Date   CREATININE 0.67 01/03/2021   BUN 7 01/03/2021   NA 141 01/03/2021   K 4.2 01/03/2021   CL 102 01/03/2021   CO2 23 01/03/2021   Lab Results  Component Value Date   ALT 26 01/03/2021   AST 20 01/03/2021   ALKPHOS 110 01/03/2021   BILITOT 0.7 01/03/2021   Lab Results  Component Value Date   HGBA1C 5.6 01/03/2021   HGBA1C 5.3  04/26/2020   HGBA1C 5.0 08/13/2018   Lab Results  Component Value Date   INSULIN 9.7 01/03/2021   Lab Results  Component Value Date   TSH 1.020 01/03/2021   Lab Results  Component Value Date   CHOL 177 01/03/2021   HDL 49 01/03/2021   LDLCALC 104 (H) 01/03/2021   TRIG 133 01/03/2021   CHOLHDL 3.6 01/03/2021   Lab Results  Component Value Date   WBC 6.5 01/03/2021   HGB 15.3 01/03/2021   HCT 45.3 01/03/2021   MCV 89 01/03/2021   PLT 211 01/03/2021   Attestation Statements:   Reviewed by clinician on day of visit: allergies, medications, problem list, medical history, surgical history, family history, social history, and previous encounter notes.  I, Water quality scientist, CMA, am acting as Location manager for [employer], DO.  I have reviewed the above documentation for accuracy and completeness, and I agree with the above. Marjory Sneddon, D.O.  The [place] was signed into law in 2016 which includes the topic of electronic health records.  This provides immediate access to information in MyChart.  This includes consultation  notes, operative notes, office notes, lab results and pathology reports.  If you have any questions about what you read please let us know at your next visit so we can discuss your concerns and take corrective action if need be.  We are right here with you.

## 2021-01-29 ENCOUNTER — Other Ambulatory Visit: Payer: Self-pay

## 2021-01-29 ENCOUNTER — Telehealth (INDEPENDENT_AMBULATORY_CARE_PROVIDER_SITE_OTHER): Payer: 59 | Admitting: Psychology

## 2021-01-29 DIAGNOSIS — F3289 Other specified depressive episodes: Secondary | ICD-10-CM | POA: Diagnosis not present

## 2021-01-30 NOTE — Progress Notes (Unsigned)
Office: [phone]  /  Fax: [phone]    Date: February 13, 2021   Appointment Start Time: *** Duration: *** minutes Provider: Glennie Isle, Psy.D. Type of Session: Individual Therapy  Location of Patient: {gbptloc:23249} Location of Provider: Provider's home (private office) Type of Contact: Telepsychological Visit via MyChart Video Visit  Session Content: Mary Rocha is a 45 y.o. female presenting for a follow-up appointment to address the previously established treatment goal of increasing coping skills. Today's appointment was a telepsychological visit due to COVID-19. Glorene provided verbal consent for today's telepsychological appointment and she is aware she is responsible for securing confidentiality on her end of the session. Prior to proceeding with today's appointment, Jacquelynn's physical location at the time of this appointment was obtained as well a phone number she could be reached at in the event of technical difficulties. Angelisa and this provider participated in today's telepsychological service.   This provider conducted a brief check-in and verbally administered the PHQ-9 and GAD-7. *** [name] was receptive to today's appointment as evidenced by openness to sharing, responsiveness to feedback, and {gbreceptiveness:23401}.  Mental Status Examination:  Appearance: {Appearance:22431} Behavior: {Behavior:22445} Mood: {gbmood:21757} Affect: {Affect:22436} Speech: {Speech:22432} Eye Contact: {Eye Contact:22433} Psychomotor Activity: {Motor Activity:22434} Gait: {gbgait:23404} Thought Process: {thought process:22448}  Thought Content/Perception: {disturbances:22451} Orientation: {Orientation:22437} Memory/Concentration: {gbcognition:22449} Insight/Judgment: {Insight:22446}  Structured Assessments Results: The Patient Health Questionnaire-9 (PHQ-9) is a self-report measure that assesses symptoms and severity of depression over the course of the last two weeks. [name]  obtained a score of *** suggesting {GBPHQ9SEVERITY:21752}. [name] finds the endorsed symptoms to be {gbphq9difficulty:21754}. [0= Not at all; 1= Several days; 2= More than half the days; 3= Nearly every day] Little interest or pleasure in doing things ***  Feeling down, depressed, or hopeless ***  Trouble falling or staying asleep, or sleeping too much ***  Feeling tired or having little energy ***  Poor appetite or overeating ***  Feeling bad about yourself --- or that you are a failure or have let yourself or your family down ***  Trouble concentrating on things, such as reading the newspaper or watching television ***  Moving or speaking so slowly that other people could have noticed? Or the opposite --- being so fidgety or restless that you have been moving around a lot more than usual ***  Thoughts that you would be better off dead or hurting yourself in some way ***  PHQ-9 Score ***    The Generalized Anxiety Disorder-7 (GAD-7) is a brief self-report measure that assesses symptoms of anxiety over the course of the last two weeks. Zamaya obtained a score of *** suggesting {gbgad7severity:21753}. [name] finds the endorsed symptoms to be {gbphq9difficulty:21754}. [0= Not at all; 1= Several days; 2= Over half the days; 3= Nearly every day] Feeling nervous, anxious, on edge ***  Not being able to stop or control worrying ***  Worrying too much about different things ***  Trouble relaxing ***  Being so restless that it's hard to sit still ***  Becoming easily annoyed or irritable ***  Feeling afraid as if something awful might happen ***  GAD-7 Score ***   Interventions:  {Interventions for Progress Notes:23405}  DSM-5 Diagnosis(es): F32.89 Other Specified Depressive Disorder, Emotional Eating Behaviors  Treatment Goal & Progress: During the initial appointment with this provider, the following treatment goal was established: increase coping skills. [name] has demonstrated progress  in her goal as evidenced by {gbtxprogress:22839}. [name] also {gbtxprogress2:22951}.  Plan: The next appointment will be scheduled in {gbweeks:21758}, which will  be {gbtxmodality:23402}. The next session will focus on {Plan for Next Appointment:23400}.

## 2021-01-31 ENCOUNTER — Ambulatory Visit (INDEPENDENT_AMBULATORY_CARE_PROVIDER_SITE_OTHER): Payer: 59 | Admitting: Family Medicine

## 2021-01-31 ENCOUNTER — Encounter (INDEPENDENT_AMBULATORY_CARE_PROVIDER_SITE_OTHER): Payer: Self-pay | Admitting: Family Medicine

## 2021-01-31 ENCOUNTER — Other Ambulatory Visit: Payer: Self-pay

## 2021-01-31 VITALS — BP 108/68 | HR 66 | Temp 98.0°F | Ht 67.0 in | Wt 243.0 lb

## 2021-01-31 DIAGNOSIS — E559 Vitamin D deficiency, unspecified: Secondary | ICD-10-CM

## 2021-01-31 DIAGNOSIS — Z6838 Body mass index (BMI) 38.0-38.9, adult: Secondary | ICD-10-CM

## 2021-01-31 DIAGNOSIS — Z9189 Other specified personal risk factors, not elsewhere classified: Secondary | ICD-10-CM | POA: Diagnosis not present

## 2021-01-31 MED ORDER — VITAMIN D (ERGOCALCIFEROL) 1.25 MG (50000 UNIT) PO CAPS: 50000.0000 [IU] | ORAL_CAPSULE | ORAL | 0 refills | Status: DC

## 2021-02-07 NOTE — Progress Notes (Signed)
Chief Complaint:   OBESITY Mary Rocha is here to discuss her progress with her obesity treatment plan along with follow-up of her obesity related diagnoses.   Today's visit was #: 3 Starting weight: 244 lbs Starting date: 01/03/2021 Today's weight: 243 lbs Today's date: 01/31/2021 Weight change since last visit: 0 Total lbs lost to date: 1 lb Body mass index is 38.06 kg/m.  Total weight loss percentage to date: -0.41%  Interim History: [name] says that she eats breakfast between 7-7:30 and eats lunch at 1:00 pm.  She gets hungry between breakfast and lunch.  Drinking 100 ounces of water per day.  Plan:  Bread options reviewed with patient such as wraps and tortillas, etc.  Current Meal Plan: the Category 1 Plan for 90% of the time.  Current Exercise Plan: Walking, yoga, Peloton bike for 30-45 minutes 3-4 times per week.  Assessment/Plan:   Medications Discontinued During This Encounter  Medication Reason   Vitamin D, Ergocalciferol, (DRISDOL) 1.25 MG (50000 UNIT) CAPS capsule Reorder   Meds ordered this encounter  Medications   Vitamin D, Ergocalciferol, (DRISDOL) 1.25 MG (50000 UNIT) CAPS capsule    Sig: Take 1 capsule (50,000 Units total) by mouth every 7 (seven) days.    Dispense:  4 capsule    Refill:  0    1. Vitamin D deficiency Not at goal. Current vitamin D is 18.2, tested on 01/03/2021. Optimal goal > 50 ng/dL.  She is taking vitamin D 50,000 IU weekly.  Plan: Continue to take prescription Vitamin D @50 ,000 IU every week as prescribed.  Follow-up for routine testing of Vitamin D, at least 2-3 times per year to avoid over-replacement.  - Refill Vitamin D, Ergocalciferol, (DRISDOL) 1.25 MG (50000 UNIT) CAPS capsule; Take 1 capsule (50,000 Units total) by mouth every 7 (seven) days.  Dispense: 4 capsule; Refill: 0  2. At risk for deficient intake of food Callista was given extensive education and counseling today of more than 8 minutes on risks associated with  deficient food intake.  Counseled her on the importance of following our prescribed meal plan and eating adequate amounts of protein.  Discussed with Devra Stare [place]ton that inadequate food intake over longer periods of time can slow their metabolism down significantly.    3. Obesity, current BMI 38.1  Course: [name] is currently in the action stage of change. As such, her goal is to continue with weight loss efforts.   Nutrition goals: She has agreed to the Category 1 Plan, but add 100 calories to AM meal but on workout days, follow Category 2 meal plan.   Exercise goals:  As is.  Behavioral modification strategies: avoiding temptations and planning for success.  Avyanna has agreed to follow-up with our clinic in 3 weeks. She was informed of the importance of frequent follow-up visits to maximize her success with intensive lifestyle modifications for her multiple health conditions.   Objective:   Blood pressure 108/68, pulse 66, temperature 98 F (36.7 C), height 5\' 7"  (1.702 m), weight 243 lb (110.2 kg), SpO2 97 %. Body mass index is 38.06 kg/m.  General: Cooperative, alert, well developed, in no acute distress. HEENT: Conjunctivae and lids unremarkable. Cardiovascular: Regular rhythm.  Lungs: Normal work of breathing. Neurologic: No focal deficits.   Lab Results  Component Value Date   CREATININE 0.67 01/03/2021   BUN 7 01/03/2021   NA 141 01/03/2021   K 4.2 01/03/2021   CL 102 01/03/2021   CO2 23 01/03/2021  Lab Results  Component Value Date   ALT 26 01/03/2021   AST 20 01/03/2021   ALKPHOS 110 01/03/2021   BILITOT 0.7 01/03/2021   Lab Results  Component Value Date   HGBA1C 5.6 01/03/2021   HGBA1C 5.3 04/26/2020   HGBA1C 5.0 08/13/2018   Lab Results  Component Value Date   INSULIN 9.7 01/03/2021   Lab Results  Component Value Date   TSH 1.020 01/03/2021   Lab Results  Component Value Date   CHOL 177 01/03/2021   HDL 49 01/03/2021   LDLCALC 104 (H)  01/03/2021   TRIG 133 01/03/2021   CHOLHDL 3.6 01/03/2021   Lab Results  Component Value Date   WBC 6.5 01/03/2021   HGB 15.3 01/03/2021   HCT 45.3 01/03/2021   MCV 89 01/03/2021   PLT 211 01/03/2021   Attestation Statements:   Reviewed by clinician on day of visit: allergies, medications, problem list, medical history, surgical history, family history, social history, and previous encounter notes.  I, Water quality scientist, CMA, am acting as Location manager for [employer], DO.  I have reviewed the above documentation for accuracy and completeness, and I agree with the above. Marjory Sneddon, D.O.  The [place] was signed into law in 2016 which includes the topic of electronic health records.  This provides immediate access to information in MyChart.  This includes consultation notes, operative notes, office notes, lab results and pathology reports.  If you have any questions about what you read please let us know at your next visit so we can discuss your concerns and take corrective action if need be.  We are right here with you.

## 2021-02-13 ENCOUNTER — Telehealth (INDEPENDENT_AMBULATORY_CARE_PROVIDER_SITE_OTHER): Payer: Self-pay | Admitting: Psychology

## 2021-02-13 ENCOUNTER — Telehealth (INDEPENDENT_AMBULATORY_CARE_PROVIDER_SITE_OTHER): Payer: 59 | Admitting: Psychology

## 2021-02-13 NOTE — Telephone Encounter (Signed)
  Office: [phone]  /  Fax: [phone]  Date of Call: February 13, 2021  Time of Call: 12:36pm Duration of Call: ~ 4 minutes Provider: Glennie Isle, PsyD  CONTENT: This provider called Mary Rocha to check-in as she did not present for today's MyChart Video Visit appointment at 12:00pm. Mary Rocha reported she cancelled the appointment when she received the automated reminder, adding she is in [place]. The appointment was not cancelled in Epic; therefore, she was informed this provider will make sure the appointment is cancelled and the no show fee is not applied. She was receptive to rescheduling. A brief check-in was conducted. She stated she is "doing better for the most part" with her eating habits. All questions/concerns addressed. No evidence or endorsement of safety concerns.   PLAN: Mary Rocha is scheduled for an appointment on March 06, 2021 at 9:30am via [facility] Visit.

## 2021-02-20 NOTE — Progress Notes (Unsigned)
Office: [phone]  /  Fax: [phone]    Date: March 06, 2021   Appointment Start Time: *** Duration: *** minutes Provider: Glennie Isle, Psy.D. Type of Session: Individual Therapy  Location of Patient: {gbptloc:23249} Location of Provider: Provider's home (private office) Type of Contact: Telepsychological Visit via MyChart Video Visit  Session Content: Mary Rocha is a 45 y.o. female presenting for a follow-up appointment to address the previously established treatment goal of increasing coping skills. Today's appointment was a telepsychological visit due to COVID-19. Mary Rocha provided verbal consent for today's telepsychological appointment and she is aware she is responsible for securing confidentiality on her end of the session. Prior to proceeding with today's appointment, Mary Rocha's physical location at the time of this appointment was obtained as well a phone number she could be reached at in the event of technical difficulties. Mary Rocha and this provider participated in today's telepsychological service.   This provider conducted a brief check-in and verbally administered the PHQ-9 and GAD-7. *** Mary Rocha was receptive to today's appointment as evidenced by openness to sharing, responsiveness to feedback, and {gbreceptiveness:23401}.  Mental Status Examination:  Appearance: {Appearance:22431} Behavior: {Behavior:22445} Mood: {gbmood:21757} Affect: {Affect:22436} Speech: {Speech:22432} Eye Contact: {Eye Contact:22433} Psychomotor Activity: unable to assess Gait: unable to assess Thought Process: {thought process:22448}  Thought Content/Perception: {disturbances:22451} Orientation: {Orientation:22437} Memory/Concentration: {gbcognition:22449} Insight/Judgment: {Insight:22446}  Structured Assessments Results: The Patient Health Questionnaire-9 (PHQ-9) is a self-report measure that assesses symptoms and severity of depression over the course of the last two weeks. Mary Rocha obtained  a score of *** suggesting {GBPHQ9SEVERITY:21752}. Mary Rocha finds the endorsed symptoms to be {gbphq9difficulty:21754}. [0= Not at all; 1= Several days; 2= More than half the days; 3= Nearly every day] Little interest or pleasure in doing things ***  Feeling down, depressed, or hopeless ***  Trouble falling or staying asleep, or sleeping too much ***  Feeling tired or having little energy ***  Poor appetite or overeating ***  Feeling bad about yourself --- or that you are a failure or have let yourself or your family down ***  Trouble concentrating on things, such as reading the newspaper or watching television ***  Moving or speaking so slowly that other people could have noticed? Or the opposite --- being so fidgety or restless that you have been moving around a lot more than usual ***  Thoughts that you would be better off dead or hurting yourself in some way ***  PHQ-9 Score ***    The Generalized Anxiety Disorder-7 (GAD-7) is a brief self-report measure that assesses symptoms of anxiety over the course of the last two weeks. Mary Rocha obtained a score of *** suggesting {gbgad7severity:21753}. Mary Rocha finds the endorsed symptoms to be {gbphq9difficulty:21754}. [0= Not at all; 1= Several days; 2= Over half the days; 3= Nearly every day] Feeling nervous, anxious, on edge ***  Not being able to stop or control worrying ***  Worrying too much about different things ***  Trouble relaxing ***  Being so restless that it's hard to sit still ***  Becoming easily annoyed or irritable ***  Feeling afraid as if something awful might happen ***  GAD-7 Score ***   Interventions:  {Interventions:22172}  DSM-5 Diagnosis(es): F32.89 Other Specified Depressive Disorder, Emotional Eating Behaviors  Treatment Goal & Progress: During the initial appointment with this provider, the following treatment goal was established: increase coping skills. Mary Rocha has demonstrated progress in her goal as evidenced by  {gbtxprogress:22839}. Mary Rocha also {gbtxprogress2:22951}.  Plan: The next appointment will be scheduled in {gbweeks:21758}, which will  be {gbtxmodality:23402}. The next session will focus on {Plan for Next Appointment:23400}.

## 2021-02-21 ENCOUNTER — Ambulatory Visit (INDEPENDENT_AMBULATORY_CARE_PROVIDER_SITE_OTHER): Payer: 59 | Admitting: Family Medicine

## 2021-03-06 ENCOUNTER — Telehealth (INDEPENDENT_AMBULATORY_CARE_PROVIDER_SITE_OTHER): Payer: 59 | Admitting: Psychology

## 2021-03-19 ENCOUNTER — Ambulatory Visit (INDEPENDENT_AMBULATORY_CARE_PROVIDER_SITE_OTHER): Payer: Self-pay | Admitting: Family Medicine

## 2021-03-19 ENCOUNTER — Encounter (INDEPENDENT_AMBULATORY_CARE_PROVIDER_SITE_OTHER): Payer: Self-pay

## 2021-04-24 ENCOUNTER — Ambulatory Visit (INDEPENDENT_AMBULATORY_CARE_PROVIDER_SITE_OTHER): Payer: 59 | Admitting: Osteopathic Medicine

## 2021-04-24 ENCOUNTER — Encounter: Payer: Self-pay | Admitting: Osteopathic Medicine

## 2021-04-24 ENCOUNTER — Other Ambulatory Visit: Payer: Self-pay

## 2021-04-24 VITALS — BP 106/71 | HR 71 | Ht 67.0 in | Wt 250.0 lb

## 2021-04-24 DIAGNOSIS — Z23 Encounter for immunization: Secondary | ICD-10-CM

## 2021-04-24 DIAGNOSIS — Z1231 Encounter for screening mammogram for malignant neoplasm of breast: Secondary | ICD-10-CM

## 2021-04-24 DIAGNOSIS — E559 Vitamin D deficiency, unspecified: Secondary | ICD-10-CM

## 2021-04-24 DIAGNOSIS — E8881 Metabolic syndrome: Secondary | ICD-10-CM

## 2021-04-24 DIAGNOSIS — Z0000 Encounter for general adult medical examination without abnormal findings: Secondary | ICD-10-CM

## 2021-04-24 NOTE — Progress Notes (Signed)
Mary Rocha is a 45 y.o. female who presents to  [place] at [facility]  today, 04/24/21, seeking care for the following:  Annual physical       ASSESSMENT & PLAN with other pertinent findings:  The primary encounter diagnosis was Annual physical exam. Diagnoses of Need for influenza vaccination, Vitamin D deficiency, Insulin resistance, and Breast cancer screening by mammogram were also pertinent to this visit.   Gets Pap/mammo w/ OBGYN Flu vax today Advised let us know re: colon cancer screening when she turns 45 if desired   Patient Instructions  General Preventive Care Most recent routine screening labs: ordered.  Blood pressure goal 130/80 or less.  Tobacco: don't!  Alcohol: responsible moderation is ok for most adults - if you have concerns about your alcohol intake, please talk to me!  Exercise: as tolerated to reduce risk of cardiovascular disease and diabetes. Strength training will also prevent osteoporosis.  Mental health: if need for mental health care (medicines, counseling, other), or concerns about moods, please let me know!  Sexual / Reproductive health: if need for STD testing, or if concerns with libido/pain problems, or if you need to discuss family planning, please let me or OBGYN know! Advanced Directive: Living Will and/or Healthcare Power of Attorney recommended for all adults, regardless of age or health.  Vaccines Flu vaccine: for almost everyone, every fall.  Shingles vaccine: after age 61.  Pneumonia vaccines: after age 44 Tetanus booster: every 10 years (due 2024) COVID vaccine: THANKS for getting your vaccine! Fall booster is recommended!  Cancer screenings  Colon cancer screening: for everyone age 7-75. Cologuard stool test (only for lower-risk individuals - no family history colon cancer or personal history of abnormal colonoscopy) or can do colonoscopy  Breast cancer screening: mammogram  ordered, someone should call you to schedule or you can contact imaging [phone] Cervical cancer screening: Pap every 5 years fif normal. Due 03/2023 Lung cancer screening: not needed for non-smokers  Infection screenings  HIV: recommended screening at least once age 50-65 Gonorrhea/Chlamydia: screening as needed. Hepatitis C: recommended once for everyone age 0000000 TB: certain at-risk populations Other Bone Density Test: recommended for women at age 41  Orders Placed This Encounter  Procedures   Flu Vaccine QUAD 6+ mos PF IM (Fluarix Quad PF)   Immunization History  Administered Date(s) Administered   Influenza,inj,Quad PF,6+ Mos 11/04/2013, 08/04/2018, 04/24/2021   Influenza-Unspecified 06/08/2019   Moderna Sars-Covid-2 Vaccination 09/18/2019, 10/19/2019, 07/25/2020   Tdap 07/29/2013    No orders of the defined types were placed in this encounter.    See below for relevant physical exam findings  See below for recent lab and imaging results reviewed  Medications, allergies, PMH, PSH, SocH, Orange reviewed below    Follow-up instructions: Return in about 1 year (around 04/24/2022) for [place] (OR SOONER IF NEEDED), OUR FRONT DESK WILL CALL YOU TO SCHEDULE.                                        Exam:  BP 106/71   Pulse 71   Ht '5\' 7"'$  (1.702 m)   Wt 250 lb (113.4 kg)   BMI 39.16 kg/m  Constitutional: VS see above. General Appearance: alert, well-developed, well-nourished, NAD Neck: No masses, trachea midline.  Respiratory: Normal respiratory effort. no wheeze, no rhonchi, no rales Cardiovascular: S1/S2 normal, no  murmur, no rub/gallop auscultated. RRR.  Musculoskeletal: Gait normal. Symmetric and independent movement of all extremities Abdominal: non-tender, non-distended, no appreciable organomegaly, neg Murphy's, BS WNLx4 Neurological: Normal balance/coordination. No tremor. Skin: warm, dry, intact.   Psychiatric: Normal judgment/insight. Normal mood and affect. Oriented x3.   Current Meds  Medication Sig   norgestimate-ethinyl estradiol (ORTHO-CYCLEN) 0.25-35 MG-MCG tablet Take 1 tablet by mouth daily.   simvastatin (ZOCOR) 20 MG tablet Take 1 tablet (20 mg total) by mouth daily.    No Known Allergies  Patient Active Problem List   Diagnosis Date Noted   At risk for deficient intake of food 01/31/2021   At risk for impaired metabolic function [id number]   Depression 01/17/2021   Other fatigue 01/03/2021   At risk for diabetes mellitus 01/03/2021   Other hyperlipidemia 01/03/2021   Vitamin D deficiency 01/03/2021   Depressed mood 01/03/2021   Hypercholesterolemia 05/28/2020   Hemorrhoids 08/26/2019   Benign nevus 08/04/2018   Irregular periods 08/04/2018   Family history of early menopause 08/04/2018   History of gestational diabetes 08/04/2018   S/P cesarean section 11/02/2013    Class: Status post   Infertility, female 09/10/2012    Family History  Problem Relation Age of Onset   Diabetes Maternal Aunt    Thyroid disease Maternal Grandmother    Pancreatic cancer Maternal Grandmother    Cancer Maternal Grandfather        lymphoma   Heart disease Paternal Grandfather    Cancer Paternal Grandfather        pancreatic   Pancreatic cancer Paternal Grandmother    Pancreatic cancer Paternal Aunt    Asthma Mother    High Cholesterol Father    Hyperlipidemia Father    Heart disease Father    Alcoholism Father    High Cholesterol Sister     Social History   Tobacco Use  Smoking Status Never  Smokeless Tobacco Never    Past Surgical History:  Procedure Laterality Date   CESAREAN SECTION N/A 11/02/2013   Procedure: CESAREAN SECTION;  Surgeon: Darlyn Chamber, MD;  Location: [facility];  Service: Obstetrics;  Laterality: N/A;   CHOLECYSTECTOMY     TONSILLECTOMY      Immunization History  Administered Date(s) Administered   Influenza,inj,Quad PF,6+ Mos 11/04/2013,  08/04/2018, 04/24/2021   Influenza-Unspecified 06/08/2019   Moderna Sars-Covid-2 Vaccination 09/18/2019, 10/19/2019, 07/25/2020   Tdap 07/29/2013    No results found for this or any previous visit (from the past 2160 hour(s)).  No results found.     All questions at time of visit were answered - patient instructed to contact office with any additional concerns or updates. ER/RTC precautions were reviewed with the patient as applicable.   Please note: manual typing as well as voice recognition software may have been used to produce this document - typos may escape review. Please contact Dr. Sheppard Coil for any needed clarifications.

## 2021-04-24 NOTE — Patient Instructions (Addendum)
General Preventive Care Most recent routine screening labs: ordered.  Blood pressure goal 130/80 or less.  Tobacco: don't!  Alcohol: responsible moderation is ok for most adults - if you have concerns about your alcohol intake, please talk to me!  Exercise: as tolerated to reduce risk of cardiovascular disease and diabetes. Strength training will also prevent osteoporosis.  Mental health: if need for mental health care (medicines, counseling, other), or concerns about moods, please let me know!  Sexual / Reproductive health: if need for STD testing, or if concerns with libido/pain problems, or if you need to discuss family planning, please let me or OBGYN know! Advanced Directive: Living Will and/or Healthcare Power of Attorney recommended for all adults, regardless of age or health.  Vaccines Flu vaccine: for almost everyone, every fall.  Shingles vaccine: after age 6.  Pneumonia vaccines: after age 37 Tetanus booster: every 10 years (due 2024) COVID vaccine: THANKS for getting your vaccine! Fall booster is recommended!  Cancer screenings  Colon cancer screening: for everyone age 21-75. Cologuard stool test (only for lower-risk individuals - no family history colon cancer or personal history of abnormal colonoscopy) or can do colonoscopy  Breast cancer screening: mammogram ordered, someone should call you to schedule or you can contact imaging [phone] Cervical cancer screening: Pap every 5 years fif normal. Due 03/2023 Lung cancer screening: not needed for non-smokers  Infection screenings  HIV: recommended screening at least once age 94-65 Gonorrhea/Chlamydia: screening as needed. Hepatitis C: recommended once for everyone age 0000000 TB: certain at-risk populations Other Bone Density Test: recommended for women at age 80

## 2021-09-08 ENCOUNTER — Other Ambulatory Visit: Payer: Self-pay | Admitting: Osteopathic Medicine

## 2021-10-08 ENCOUNTER — Other Ambulatory Visit: Payer: Self-pay | Admitting: Medical-Surgical

## 2022-02-06 ENCOUNTER — Ambulatory Visit (INDEPENDENT_AMBULATORY_CARE_PROVIDER_SITE_OTHER): Payer: 59 | Admitting: Family Medicine

## 2022-02-06 VITALS — Temp 98.2°F

## 2022-02-06 DIAGNOSIS — Z23 Encounter for immunization: Secondary | ICD-10-CM | POA: Diagnosis not present

## 2022-02-06 NOTE — Progress Notes (Signed)
Agree with documentation as above.   Dreden Rivere, MD  

## 2022-04-03 ENCOUNTER — Encounter (INDEPENDENT_AMBULATORY_CARE_PROVIDER_SITE_OTHER): Payer: Self-pay

## 2022-04-17 ENCOUNTER — Telehealth: Payer: Self-pay | Admitting: Family Medicine

## 2022-04-17 ENCOUNTER — Other Ambulatory Visit: Payer: Self-pay | Admitting: Family Medicine

## 2022-04-17 DIAGNOSIS — Z0000 Encounter for general adult medical examination without abnormal findings: Secondary | ICD-10-CM

## 2022-04-17 NOTE — Telephone Encounter (Signed)
Pt would like to come in on 08/25 to have labs done for her physical on 09/01.

## 2022-04-17 NOTE — Progress Notes (Signed)
Pt requesting labs prior to visit. Have ordered lab set.

## 2022-04-20 LAB — COMPLETE METABOLIC PANEL WITH GFR
AG Ratio: 1.8 (calc) (ref 1.0–2.5)
ALT: 21 U/L (ref 6–29)
AST: 15 U/L (ref 10–35)
Albumin: 4.2 g/dL (ref 3.6–5.1)
Alkaline phosphatase (APISO): 102 U/L (ref 31–125)
BUN: 12 mg/dL (ref 7–25)
CO2: 25 mmol/L (ref 20–32)
Calcium: 9 mg/dL (ref 8.6–10.2)
Chloride: 106 mmol/L (ref 98–110)
Creat: 0.72 mg/dL (ref 0.50–0.99)
Globulin: 2.3 g/dL (calc) (ref 1.9–3.7)
Glucose, Bld: 140 mg/dL — ABNORMAL HIGH (ref 65–99)
Potassium: 4.7 mmol/L (ref 3.5–5.3)
Sodium: 141 mmol/L (ref 135–146)
Total Bilirubin: 0.4 mg/dL (ref 0.2–1.2)
Total Protein: 6.5 g/dL (ref 6.1–8.1)
eGFR: 105 mL/min/{1.73_m2} (ref >=60)

## 2022-04-20 LAB — CBC
HCT: 45.3 % — ABNORMAL HIGH (ref 35.0–45.0)
Hemoglobin: 15.2 g/dL (ref 11.7–15.5)
MCH: 28.7 pg (ref 27.0–33.0)
MCHC: 33.6 g/dL (ref 32.0–36.0)
MCV: 85.5 fL (ref 80.0–100.0)
MPV: 10.5 fL (ref 7.5–12.5)
Platelets: 212 10*3/uL (ref 140–400)
RBC: 5.3 10*6/uL — ABNORMAL HIGH (ref 3.80–5.10)
RDW: 14 % (ref 11.0–15.0)
WBC: 5.3 10*3/uL (ref 3.8–10.8)

## 2022-04-20 LAB — LIPID PANEL
Cholesterol: 247 mg/dL — ABNORMAL HIGH (ref <200)
HDL: 53 mg/dL (ref >=50)
LDL Cholesterol (Calc): 161 mg/dL (calc) — ABNORMAL HIGH
Non-HDL Cholesterol (Calc): 194 mg/dL (calc) — ABNORMAL HIGH (ref <130)
Total CHOL/HDL Ratio: 4.7 (calc) (ref <5.0)
Triglycerides: 180 mg/dL — ABNORMAL HIGH (ref <150)

## 2022-04-20 LAB — HEMOGLOBIN A1C
Hgb A1c MFr Bld: 5.8 % of total Hgb — ABNORMAL HIGH (ref <5.7)
Mean Plasma Glucose: 120 mg/dL
eAG (mmol/L): 6.6 mmol/L

## 2022-04-20 LAB — TSH: TSH: 1.3 mIU/L

## 2022-04-22 ENCOUNTER — Encounter: Payer: Self-pay | Admitting: Family Medicine

## 2022-04-22 ENCOUNTER — Ambulatory Visit: Payer: 59 | Admitting: Family Medicine

## 2022-04-22 DIAGNOSIS — J209 Acute bronchitis, unspecified: Secondary | ICD-10-CM | POA: Insufficient documentation

## 2022-04-22 MED ORDER — PREDNISONE 50 MG PO TABS: ORAL_TABLET | ORAL | 0 refills | Status: DC

## 2022-04-22 MED ORDER — ALBUTEROL SULFATE HFA 108 (90 BASE) MCG/ACT IN AERS: 2.0000 | INHALATION_SPRAY | Freq: Four times a day (QID) | RESPIRATORY_TRACT | 0 refills | Status: DC | PRN

## 2022-04-22 NOTE — Progress Notes (Signed)
Mary Rocha - 46 y.o. female MRN [id number]  Date of birth: 16-Nov-1975  Subjective Chief Complaint  Patient presents with   dry cough    HPI Mary Rocha is a 46 y.o. female here today with complaint of dry cough.  [place]  Developed laryngitis that lasted a few days.  Now has dry cough.  Also having some wheezing and tightness in her chest.  Has been told in the past that she has asthma/reactive airways.  She has been using her daughter's albuterol recently.  She does get relief from albuterol.    She denies fever, chills, GI symptoms, headache, congestion, or sinus pain/pressure.  Multiple COVID tests at home have been negative.    ROS:  A comprehensive ROS was completed and negative except as noted per HPI  No Known Allergies  Past Medical History:  Diagnosis Date   Allergy    AMA (advanced maternal age) multigravida 35+    Anemia    Back pain    Gestational diabetes    Headache(784.0)    High cholesterol    History of cholecystectomy    History of gestational diabetes 08/04/2018   Infertility, female    Lactose intolerance    Newborn product of IVF pregnancy    Ovarian cyst    Vitamin D deficiency     Past Surgical History:  Procedure Laterality Date   CESAREAN SECTION N/A 11/02/2013   Procedure: CESAREAN SECTION;  Surgeon: Darlyn Chamber, MD;  Location: [facility];  Service: Obstetrics;  Laterality: N/A;   CHOLECYSTECTOMY     TONSILLECTOMY      Social History   Socioeconomic History   Marital status: Married    Spouse name: Education administrator   Number of children: 1   Years of education: Not on file   Highest education level: Not on file  Occupational History   Occupation: SPEECH LANGUAGE PATHOLOGIST    Employer: [employer]  Tobacco Use   Smoking status: Never   Smokeless tobacco: Never  Vaping Use   Vaping Use: Never used  Substance and Sexual Activity   Alcohol use: Yes    Comment: 1 - 2  drinks / month   Drug use: Never   Sexual activity: Yes    Partners: Male    Birth control/protection: None    Comment: Infertility  Other Topics Concern   Not on file  Social History Narrative   Not on file   Social Determinants of Health   Financial Resource Strain: Not on file  Food Insecurity: Not on file  Transportation Needs: Not on file  Physical Activity: Not on file  Stress: Not on file  Social Connections: Not on file    Family History  Problem Relation Age of Onset   Diabetes Maternal Aunt    Thyroid disease Maternal Grandmother    Pancreatic cancer Maternal Grandmother    Cancer Maternal Grandfather        lymphoma   Heart disease Paternal Grandfather    Cancer Paternal Grandfather        pancreatic   Pancreatic cancer Paternal Grandmother    Pancreatic cancer Paternal Aunt    Asthma Mother    High Cholesterol Father    Hyperlipidemia Father    Heart disease Father    Alcoholism Father    High Cholesterol Sister     Health Maintenance  Topic Date Due   Hepatitis C Screening  Never done  MAMMOGRAM  04/21/2019   COLONOSCOPY (Pts 45-62yr Insurance coverage will need to be confirmed)  Never done   INFLUENZA VACCINE  06/22/2022 (Originally 03/26/2022)   COVID-19 Vaccine (4 - Moderna risk series) 06/22/2022 (Originally 09/19/2020)   PAP SMEAR-Modifier  04/21/2023   TETANUS/TDAP  02/07/2032   HIV Screening  Completed   HPV VACCINES  Aged Out     ----------------------------------------------------------------------------------------------------------------------------------------------------------------------------------------------------------------- Physical Exam BP 113/72 (BP Location: Right Arm, Cuff Size: Large)   Pulse 79   Temp 98 F (36.7 C)   Resp 20   Ht '5\' 7"'$  (1.702 m)   Wt 258 lb 0.6 oz (117 kg)   SpO2 98%   BMI 40.41 kg/m   Physical Exam Constitutional:      Appearance: Normal appearance.  Eyes:     General: No scleral  icterus. Cardiovascular:     Rate and Rhythm: Normal rate and regular rhythm.  Pulmonary:     Comments: Mild scattered wheezes.  Musculoskeletal:     Cervical back: Neck supple.  Neurological:     General: No focal deficit present.     Mental Status: She is alert.  Psychiatric:        Mood and Affect: Mood normal.        Behavior: Behavior normal.     ------------------------------------------------------------------------------------------------------------------------------------------------------------------------------------------------------------------- Assessment and Plan  Acute bronchitis Recommend conservative care with increased fluids and OTC cough suppressant.  Adding burst of prednisone '50mg'$  daily and albuterol as needed.  Contact clinic if having new/worsening symptoms.    Meds ordered this encounter  Medications   predniSONE (DELTASONE) 50 MG tablet    Sig: Take 1 tab po daily x5 days.    Dispense:  5 tablet    Refill:  0   albuterol (VENTOLIN HFA) 108 (90 Base) MCG/ACT inhaler    Sig: Inhale 2 puffs into the lungs every 6 (six) hours as needed for wheezing or shortness of breath.    Dispense:  8 g    Refill:  0    No follow-ups on file.    This visit occurred during the SARS-CoV-2 public health emergency.  Safety protocols were in place, including screening questions prior to the visit, additional usage of staff PPE, and extensive cleaning of exam room while observing appropriate contact time as indicated for disinfecting solutions.

## 2022-04-22 NOTE — Patient Instructions (Signed)

## 2022-04-22 NOTE — Assessment & Plan Note (Signed)
Recommend conservative care with increased fluids and OTC cough suppressant.  Adding burst of prednisone '50mg'$  daily and albuterol as needed.  Contact clinic if having new/worsening symptoms.

## 2022-04-25 ENCOUNTER — Encounter: Payer: 59 | Admitting: Family Medicine

## 2022-04-26 ENCOUNTER — Encounter: Payer: Self-pay | Admitting: Family Medicine

## 2022-04-26 ENCOUNTER — Ambulatory Visit (INDEPENDENT_AMBULATORY_CARE_PROVIDER_SITE_OTHER): Payer: 59 | Admitting: Family Medicine

## 2022-04-26 VITALS — BP 123/79 | HR 52 | Ht 67.0 in | Wt 258.0 lb

## 2022-04-26 DIAGNOSIS — Z0000 Encounter for general adult medical examination without abnormal findings: Secondary | ICD-10-CM | POA: Diagnosis not present

## 2022-04-26 DIAGNOSIS — R7303 Prediabetes: Secondary | ICD-10-CM | POA: Diagnosis not present

## 2022-04-26 DIAGNOSIS — Z23 Encounter for immunization: Secondary | ICD-10-CM

## 2022-04-26 DIAGNOSIS — Z1211 Encounter for screening for malignant neoplasm of colon: Secondary | ICD-10-CM

## 2022-04-26 MED ORDER — BENZONATATE 100 MG PO CAPS: 100.0000 mg | ORAL_CAPSULE | Freq: Three times a day (TID) | ORAL | 0 refills | Status: AC | PRN

## 2022-04-26 NOTE — Progress Notes (Signed)
Annual Wellness Visit     Patient: Mary Rocha, Female    DOB: 1976/08/15, 46 y.o.   MRN: [id number]  Subjective  Chief Complaint  Patient presents with   Annual Exam    [name] is a 46 y.o. female who presents today for her Annual Wellness Visit. She reports consuming a general diet.  Exercises 4x a week when she has time.  She generally feels well. She reports sleeping well.    Vision:Within last year and Dental: No current dental problems and Last dental visit: 6 months ago.  Mammogram: November mammo scheduled.  Cancers: pancreatic cancer runs in the family (she had genetic testing that was negative)   Pt is doing ok she is currently thinking that she and her husband are going to get a divorce.   Medications: Outpatient Medications Prior to Visit  Medication Sig Note   albuterol (VENTOLIN HFA) 108 (90 Base) MCG/ACT inhaler Inhale 2 puffs into the lungs every 6 (six) hours as needed for wheezing or shortness of breath.    predniSONE (DELTASONE) 50 MG tablet Take 1 tab po daily x5 days.    simvastatin (ZOCOR) 20 MG tablet Take 1 tablet (20 mg total) by mouth daily. NO REFILLS. NEEDS TO TRANSFER CARE TO NEW PCP 04/26/2022: Pt has been out of this medication for 3 months   No facility-administered medications prior to visit.    No Known Allergies  Patient Care Team: Owens Loffler, DO as PCP - General (Family Medicine)  Review of Systems  Constitutional:  Negative for chills and fever.  Respiratory:  Negative for cough and shortness of breath.   Cardiovascular:  Negative for chest pain.  Neurological:  Negative for headaches.      Objective  BP 123/79   Pulse (!) 52   Ht '5\' 7"'$  (1.702 m)   Wt 258 lb (117 kg)   SpO2 99%   BMI 40.41 kg/m    Physical Exam Vitals and nursing note reviewed.  Constitutional:      General: She is not in acute distress.    Appearance: Normal appearance.  HENT:     Head: Normocephalic and atraumatic.     Right  Ear: Tympanic membrane, ear canal and external ear normal.     Left Ear: Tympanic membrane, ear canal and external ear normal.     Nose: Nose normal.     Mouth/Throat:     Mouth: Mucous membranes are moist.  Eyes:     Conjunctiva/sclera: Conjunctivae normal.  Cardiovascular:     Rate and Rhythm: Normal rate and regular rhythm.  Pulmonary:     Effort: Pulmonary effort is normal.     Breath sounds: Normal breath sounds.  Abdominal:     General: Abdomen is flat.     Palpations: Abdomen is soft.  Musculoskeletal:     Comments: 5/5 muscle strength upper and lower extremity b/l  Neurological:     General: No focal deficit present.     Mental Status: She is alert and oriented to person, place, and time.  Psychiatric:        Mood and Affect: Mood normal.        Behavior: Behavior normal.        Thought Content: Thought content normal.        Judgment: Judgment normal.      No results found for any visits on 04/26/22.    Assessment & Plan   Annual wellness visit done today including the  all of the following: Reviewed patient's Family Medical History Reviewed and updated list of patient's medical providers Assessment of cognitive impairment was done Assessed patient's functional ability Established a written schedule for health screening [place] Completed and Reviewed  Exercise Activities and Dietary recommendations  Goals   None     Immunization History  Administered Date(s) Administered   Influenza,inj,Quad PF,6+ Mos 11/04/2013, 08/04/2018, 04/24/2021   Influenza-Unspecified 06/08/2019   Moderna Sars-Covid-2 Vaccination 09/18/2019, 10/19/2019, 07/25/2020   Tdap 07/29/2013, 02/06/2022    Health Maintenance  Topic Date Due   Hepatitis C Screening  Never done   MAMMOGRAM  04/21/2019   COLONOSCOPY (Pts 45-77yr Insurance coverage will need to be confirmed)  Never done   INFLUENZA VACCINE  06/22/2022 (Originally 03/26/2022)   COVID-19 Vaccine (4 -  Moderna risk series) 06/22/2022 (Originally 09/19/2020)   PAP SMEAR-Modifier  04/21/2023   TETANUS/TDAP  02/07/2032   HIV Screening  Completed   HPV VACCINES  Aged Out     Discussed health benefits of physical activity, and encouraged her to engage in regular exercise appropriate for her age and condition.    Problem List Items Addressed This Visit       Other   Routine adult health maintenance - Primary    - up to date on screenings - does need flu shot - discussed lab work with patient CBC, CMP wnl  - ascvd risk factor 1% based on calculations with no need for cholesterol medication at this time, recommend low cholesterol foods as well as diet and exercise - will recheck lipids in 3 months         Prediabetes    - discussed A1C with patient and that she is in the prediabetic category  - A1C of 5.8 did discuss we could start metformin or do 3 months of diet and exercise. Patient would like to discuss this with her sister and get back with me        Other Visit Diagnoses     Screening for colon cancer       Relevant Orders   Ambulatory referral to Gastroenterology       Return in about 4 weeks (around 05/24/2022). To discuss weight     EOwens Loffler DO

## 2022-04-26 NOTE — Assessment & Plan Note (Signed)
-   discussed A1C with patient and that she is in the prediabetic category  - A1C of 5.8 did discuss we could start metformin or do 3 months of diet and exercise. Patient would like to discuss this with her sister and get back with me

## 2022-04-26 NOTE — Assessment & Plan Note (Addendum)
-   up to date on screenings - does need flu shot - discussed lab work with patient CBC, CMP wnl  - ascvd risk factor 1% based on calculations with no need for cholesterol medication at this time, recommend low cholesterol foods as well as diet and exercise - will recheck lipids in 3 months

## 2022-04-26 NOTE — Addendum Note (Signed)
Addended by: Cline Crock on: 04/26/2022 04:54 PM   Modules accepted: Orders

## 2022-05-01 ENCOUNTER — Other Ambulatory Visit: Payer: Self-pay | Admitting: Family Medicine

## 2022-05-01 DIAGNOSIS — R7303 Prediabetes: Secondary | ICD-10-CM

## 2022-05-01 MED ORDER — METFORMIN HCL 500 MG PO TABS: 500.0000 mg | ORAL_TABLET | Freq: Two times a day (BID) | ORAL | 3 refills | Status: DC

## 2022-05-08 ENCOUNTER — Telehealth: Payer: 59 | Admitting: Physician Assistant

## 2022-05-08 DIAGNOSIS — J208 Acute bronchitis due to other specified organisms: Secondary | ICD-10-CM | POA: Diagnosis not present

## 2022-05-08 DIAGNOSIS — B9689 Other specified bacterial agents as the cause of diseases classified elsewhere: Secondary | ICD-10-CM

## 2022-05-08 MED ORDER — PROMETHAZINE-DM 6.25-15 MG/5ML PO SYRP: 5.0000 mL | ORAL_SOLUTION | Freq: Four times a day (QID) | ORAL | 0 refills | Status: DC | PRN

## 2022-05-08 MED ORDER — DOXYCYCLINE HYCLATE 100 MG PO TABS: 100.0000 mg | ORAL_TABLET | Freq: Two times a day (BID) | ORAL | 0 refills | Status: DC

## 2022-05-08 NOTE — Progress Notes (Signed)
We are sorry that you are not feeling well.  Here is how we plan to help!  Based on your presentation I believe you most likely have A cough due to bacteria.  When patients have a fever and a productive cough with a change in color or increased sputum production, we are concerned about bacterial bronchitis.  If left untreated it can progress to pneumonia.  If your symptoms do not improve with your treatment plan it is important that you contact your provider.   I have prescribed Doxycycline 100 mg twice a day for 7 days    I have also sent in a script for a cough syrup to take as directed. If not resolving with these measures, you need a repeat in-person evaluation.   From your responses in the eVisit questionnaire you describe inflammation in the upper respiratory tract which is causing a significant cough.  This is commonly called Bronchitis and has four common causes:   Allergies Viral Infections Acid Reflux Bacterial Infection Allergies, viruses and acid reflux are treated by controlling symptoms or eliminating the cause. An example might be a cough caused by taking certain blood pressure medications. You stop the cough by changing the medication. Another example might be a cough caused by acid reflux. Controlling the reflux helps control the cough.  USE OF BRONCHODILATOR ("RESCUE") INHALERS: There is a risk from using your bronchodilator too frequently.  The risk is that over-reliance on a medication which only relaxes the muscles surrounding the breathing tubes can reduce the effectiveness of medications prescribed to reduce swelling and congestion of the tubes themselves.  Although you feel brief relief from the bronchodilator inhaler, your asthma may actually be worsening with the tubes becoming more swollen and filled with mucus.  This can delay other crucial treatments, such as oral steroid medications. If you need to use a bronchodilator inhaler daily, several times per day, you should  discuss this with your provider.  There are probably better treatments that could be used to keep your asthma under control.     HOME CARE Only take medications as instructed by your medical team. Complete the entire course of an antibiotic. Drink plenty of fluids and get plenty of rest. Avoid close contacts especially the very young and the elderly Cover your mouth if you cough or cough into your sleeve. Always remember to wash your hands A steam or ultrasonic humidifier can help congestion.   GET HELP RIGHT AWAY IF: You develop worsening fever. You become short of breath You cough up blood. Your symptoms persist after you have completed your treatment plan MAKE SURE YOU  Understand these instructions. Will watch your condition. Will get help right away if you are not doing well or get worse.    Thank you for choosing an e-visit.  Your e-visit answers were reviewed by a board certified advanced clinical practitioner to complete your personal care plan. Depending upon the condition, your plan could have included both over the counter or prescription medications.  Please review your pharmacy choice. Make sure the pharmacy is open so you can pick up prescription now. If there is a problem, you may contact your provider through [employer] and have the prescription routed to another pharmacy.  Your safety is important to [place]. If you have drug allergies check your prescription carefully.   For the next 24 hours you can use MyChart to ask questions about today's visit, request a non-urgent call back, or ask for a work or school  You will get an email in the next two days asking about your experience. I hope that your e-visit has been valuable and will speed your recovery.  

## 2022-05-08 NOTE — Progress Notes (Signed)
I have spent 5 minutes in review of e-visit questionnaire, review and updating patient chart, medical decision making and response to patient.   Perry Molla Cody Avier Jech, PA-C    

## 2022-05-27 ENCOUNTER — Ambulatory Visit: Payer: 59 | Admitting: Family Medicine

## 2022-05-27 ENCOUNTER — Encounter: Payer: Self-pay | Admitting: Family Medicine

## 2022-05-27 VITALS — BP 124/73 | HR 74 | Ht 67.0 in | Wt 258.0 lb

## 2022-05-27 DIAGNOSIS — Z635 Disruption of family by separation and divorce: Secondary | ICD-10-CM

## 2022-05-27 DIAGNOSIS — R7303 Prediabetes: Secondary | ICD-10-CM

## 2022-05-27 NOTE — Patient Instructions (Signed)
Wegovy (semaglutide) start at 0.'25mg'$ 

## 2022-05-27 NOTE — Assessment & Plan Note (Signed)
-   continue metformin - will need an A1C repeat at the end of Nov  - discussed diet and exercise

## 2022-05-27 NOTE — Progress Notes (Signed)
Established patient visit   Patient: Mary Rocha   DOB: 1976/07/12   46 y.o. Female  MRN: [id number] Visit Date: 05/27/2022  Today's healthcare provider: Owens Loffler, DO   Chief Complaint  Patient presents with   Follow-up    SUBJECTIVE    Chief Complaint  Patient presents with   Follow-up   HPI  A1C done showed prediabetes of 5.8. She is taking metformin '500mg'$  BID.  She is not having any concerns or difficulty taking the medication.   She has some concerns today about her overall mental health status. She and her husband are getting a divorce.   Review of Systems  Constitutional:  Negative for activity change, fatigue and fever.  Respiratory:  Negative for cough and shortness of breath.   Cardiovascular:  Negative for chest pain.  Gastrointestinal:  Negative for abdominal pain.  Genitourinary:  Negative for difficulty urinating.       Current Meds  Medication Sig   metFORMIN (GLUCOPHAGE) 500 MG tablet Take 1 tablet (500 mg total) by mouth 2 (two) times daily with a meal.    OBJECTIVE    BP 124/73   Pulse 74   Ht '5\' 7"'$  (1.702 m)   Wt 258 lb (117 kg)   SpO2 99%   BMI 40.41 kg/m   Physical Exam Vitals and nursing note reviewed.  Constitutional:      General: She is not in acute distress.    Appearance: Normal appearance.  HENT:     Head: Normocephalic and atraumatic.     Right Ear: External ear normal.     Left Ear: External ear normal.     Nose: Nose normal.  Eyes:     Conjunctiva/sclera: Conjunctivae normal.  Cardiovascular:     Rate and Rhythm: Normal rate.  Pulmonary:     Effort: Pulmonary effort is normal.     Breath sounds: Normal breath sounds.  Neurological:     General: No focal deficit present.     Mental Status: She is alert and oriented to person, place, and time.  Psychiatric:        Mood and Affect: Mood normal.        Behavior: Behavior normal.        Thought Content: Thought content normal.        Judgment:  Judgment normal.     GAD7: 2 PHQ9: 6    ASSESSMENT & PLAN    Problem List Items Addressed This Visit       Other   Prediabetes - Primary    - continue metformin - will need an A1C repeat at the end of Nov  - discussed diet and exercise      Marital problem involving divorce    - have sent referral for therapy  - pt is doing the best she can with coping      Relevant Orders   Ambulatory referral to Psychology   Morbid obesity ([place])    - BMI 40 - recommend wegovy for weight loss - counseled on diet and exercise. Pt is doing much better with food intake and is working on exercising.        Return in about 9 weeks (around 07/29/2022).      No orders of the defined types were placed in this encounter.   Orders Placed This Encounter  Procedures   Ambulatory referral to Psychology    Referral Priority:   Routine    Referral Type:  Psychiatric    Referral Reason:   Specialty Services Required    Requested Specialty:   Psychology    Number of Visits Requested:   [place], [place] [phone] (phone) [phone] (fax)  [place]

## 2022-05-27 NOTE — Assessment & Plan Note (Signed)
-   have sent referral for therapy  - pt is doing the best she can with coping

## 2022-05-27 NOTE — Assessment & Plan Note (Addendum)
-   BMI 40 - recommend wegovy for weight loss - counseled on diet and exercise. Pt is doing much better with food intake and is working on exercising.

## 2022-07-15 ENCOUNTER — Encounter: Payer: Self-pay | Admitting: Family Medicine

## 2022-07-25 ENCOUNTER — Ambulatory Visit (INDEPENDENT_AMBULATORY_CARE_PROVIDER_SITE_OTHER): Payer: 59 | Admitting: Professional

## 2022-07-25 ENCOUNTER — Encounter: Payer: Self-pay | Admitting: Professional

## 2022-07-25 DIAGNOSIS — F4321 Adjustment disorder with depressed mood: Secondary | ICD-10-CM

## 2022-07-25 NOTE — Progress Notes (Signed)
° ° ° ° ° ° ° ° ° ° ° ° ° ° °  Heaven Meeker, LCMHC °
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
## 2022-07-31 ENCOUNTER — Ambulatory Visit: Payer: Self-pay | Admitting: Family Medicine

## 2022-08-05 ENCOUNTER — Ambulatory Visit (INDEPENDENT_AMBULATORY_CARE_PROVIDER_SITE_OTHER): Payer: Self-pay | Admitting: Family Medicine

## 2022-08-05 ENCOUNTER — Ambulatory Visit (INDEPENDENT_AMBULATORY_CARE_PROVIDER_SITE_OTHER): Payer: 59 | Admitting: Professional

## 2022-08-05 ENCOUNTER — Encounter: Payer: Self-pay | Admitting: Family Medicine

## 2022-08-05 ENCOUNTER — Encounter: Payer: Self-pay | Admitting: Professional

## 2022-08-05 DIAGNOSIS — R7303 Prediabetes: Secondary | ICD-10-CM

## 2022-08-05 DIAGNOSIS — F4323 Adjustment disorder with mixed anxiety and depressed mood: Secondary | ICD-10-CM

## 2022-08-05 DIAGNOSIS — F4321 Adjustment disorder with depressed mood: Secondary | ICD-10-CM

## 2022-08-05 LAB — POCT GLYCOSYLATED HEMOGLOBIN (HGB A1C): HbA1c, POC (prediabetic range): 6.4 % (ref 5.7–6.4)

## 2022-08-05 MED ORDER — METFORMIN HCL 1000 MG PO TABS: 1000.0000 mg | ORAL_TABLET | Freq: Two times a day (BID) | ORAL | 1 refills | Status: AC

## 2022-08-05 NOTE — Progress Notes (Signed)
° ° ° ° ° ° ° ° ° ° ° ° ° ° °  Keyvin Rison, LCMHC °

## 2022-08-05 NOTE — Progress Notes (Addendum)
[facility] Counselor/Therapist Progress Note  Patient ID: Mary Rocha, MRN: [id number],    Date: 08/05/2022  Time Spent: 59 minutes [id number]  Treatment Type: Individual Therapy  Risk Assessment: Danger to Self:  No Self-injurious Behavior: No Danger to Others: No  Subjective: This session was held via video teletherapy. The patient consented to video teletherapy and was located at her home during this session. She is aware it is the responsibility of the patient to secure confidentiality on her end of the session. The provider was in a private home office for the duration of this session.    The patient arrived late for her webex appointment.   Issues: 1-health 2-father a-raging alcoholic -increased when the pt was in her early 20's b-maintains contact because he lives 600 miles away -father saw her as a child faithfully -he got married and had another baby c-when brother had his first schizophrenic episode he tried killing himself -father checked him out of hospital and took him home d-all of her father's family (mother, father, aunt, and sister all died) -Bank[employer] company nearby and people think the cancer was environmental e-pt hung up the phone after she became so angry -he was trying to get across why her sister Elmyra Ricks will not speak to him 3-healthy males -her stepfather -spouse Gerald Stabs provides but provides no emotional support -he will work as much as needed to provide for the family -Gerald Stabs raised without a female example 4-treatment planning -pt and Clinician discussed goals for treatment -pt was actively involved and is in agreement with her treatment plan  2023 Treatment Plan Problems Addressed  Anxiety, Balancing Work and Family/Multiple Roles, Depression, Partner Relational Problems  Goals 1. Address and eliminate maladaptive thought processes that lead to anxious responses. 2. Develop a realistic perspective regarding demands and  obligations of multiple roles and their completion. 3. Develop a sense of own personal power and self-esteem within the relationship. 4. Develop healthy cognitive mechanisms to facilitate positive attitudes and beliefs about self within the context of one's environment to mitigate depressive symptoms. Objective Articulate self-care methods to prevent and/or manage depression in the future; create a list of 10 coping methods for future use. Target Date: 2023-08-05 Frequency: Biweekly  Progress: 0 Modality: individual  Objective Increase the level of physical exercise. Target Date: 2023-08-05 Frequency: Biweekly  Progress: 0 Modality: individual  Objective Implement assertiveness to communicate needs and desires to others. Target Date: 2023-08-05 Frequency: Biweekly  Progress: 0 Modality: individual  Related Interventions Offer the client booster sessions as necessary. Objective Increase the frequency of engaging in pleasant activities. Target Date: 2023-08-05 Frequency: Biweekly  Progress: 0 Modality: individual  Related Interventions Empower the client to connect with a positive female role model through bibliotherapy and/or with family members, friends, or Regulatory affairs officer; review how the client may apply the adaptive behaviors of the role model to her own life. Objective Identify and replace cognitive self-talk that supports depression. Target Date: 2023-08-05 Frequency: Biweekly  Progress: 0 Modality: individual  Related Interventions Do "behavioral experiments" in which depressive automatic thoughts are treated as hypotheses/predictions, reality-based alternative hypotheses/ predictions are generated, and both are tested against the client's past, present, and/or future experiences. Encourage the client to discuss cognitive distortions, including automatic thoughts (e.g., negative view of self, future, experience) and negative schemas (e.g., core beliefs about self and others based  on earlier childhood experiences); assess frequency of negative self-statements associated with depression. Reinforce the client's positive, reality-based cognitive messages that enhance self-confidence and increase adaptive  action (see "Positive Self-Talk" in the Adult Psychotherapy Homework Planner, 2nd ed. by Bryn Gulling). Objective Articulate signs and symptoms of depression in current life experiences. Target Date: 2023-08-05 Frequency: Biweekly  Progress: 0 Modality: individual  Related Interventions Encourage the client to share her feelings of depression to gain an insight into precipitating events and implications of symptoms; normalize her feelings of depression. Encourage the client to describe current and childhood experiences associated with key relationships (e.g., family-of-origin, peer and school relationships, dating relationships, female role models, extended family relationships), roles (e.g., partner, caretaker, worker, Ship broker), and cultural experiences (e.g., gender roles) that may contribute to depression. 5. Develop the necessary skills for effective, open communication and mutually satisfying intimacy. Objective Each partner identifies her/his own role in the conflicts. Target Date: 2023-08-05 Frequency: Biweekly  Progress: 0 Modality: individual  Related Interventions Assign the couple to set aside daily 15 minutes that are distraction free during which they can communicate about nonconflictual issues; practice during the therapy session, assisting each partner in clarifying communication and expression of feelings. Seek a commitment from each partner to begin to work on changing specific behaviors on her/his list and on the list of the partner for her/him. Objective Increase the frequency and quality of communication with the partner. Target Date: 2023-08-05 Frequency: Biweekly  Progress: 0 Modality: individual  Related Interventions Validate the client's disappointment  and disillusionment with the current relationship; assist the client with expressing her expectations and hopes regarding the relationship. Objective Express thoughts and feelings regarding the relationship in a direct manner. Target Date: 2023-08-05 Frequency: Biweekly  Progress: 0 Modality: individual  Related Interventions Use behavioral techniques (e.g., education, modeling, role-playing, corrective feedback, positive reinforcement) to teach the couple problem-solving and conflict-resolution skills, including defining the problem constructively and specifically, brainstorming options, compromise, choosing options, implementing a plan, and evaluating the results. Objective Both partners agree to a "time out" signal that either partner may give to stop interaction that may escalate into abuse. Target Date: 2023-08-05 Frequency: Biweekly  Progress: 0 Modality: individual  Objective Increase time spent in enjoyable contact with the partner. Target Date: 2023-08-05 Frequency: Biweekly  Progress: 0 Modality: individual  6. Eliminate depressive and anxiety symptoms associated with trying to balance multiple roles. Objective Describe role and responsibilities associated with work and family and related thoughts, feelings, and behaviors. Target Date: 2023-08-05 Frequency: Biweekly  Progress: 0 Modality: individual  Related Interventions Explore with the client her multiple roles and responsibilities associated with work and family; clarify related thoughts and feelings. Objective Clarify values and priorities. Target Date: 2023-08-05 Frequency: Biweekly  Progress: 0 Modality: individual  Related Interventions Assign the client to read about progressive muscle relaxation and other calming strategies in relevant books or treatment manuals (e.g., Progressive Relaxation Training by Leroy Kennedy; Mastery of Your Anxiety and Conservator, museum/gallery by Birdie Hopes). Refer the client to initiate conjoint therapy with partner. Objective Implement assertiveness skills and limit setting. Target Date: 2023-08-05 Frequency: Biweekly  Progress: 0 Modality: individual  Related Interventions Use behavioral techniques (i.e., education, modeling, role-playing, corrective feedback, positive reinforcement) to teach communication skills, including assertive communication, offering positive feedback, active listening, making positive requests of others for behavior change, and giving negative feedback in an honest and respectful manner. Objective Learn and implement stress management and relaxation techniques to reduce fatigue, anxiety, and depressive symptoms. Target Date: 2023-08-05 Frequency: Biweekly  Progress: 0 Modality: individual  Related Interventions Explore, along with the client, strategies for making her life more manageable  and less stressful (e.g., waking up before the children to exercise or have a cup of tea, pack the children's lunches and backpacks the night before). Objective Communicate needs with partner regarding multiple role obligations. Target Date: 2023-08-05 Frequency: Biweekly  Progress: 0 Modality: individual  Related Interventions Help the client to develop a realistic schedule that outlines the responsibilities of her partner and family members; help the client enlist the commitment of all individuals to the schedule. Objective Learn and implement problem-solving and conflict-resolution skills. Target Date: 2023-08-05 Frequency: Biweekly  Progress: 0 Modality: individual  Related Interventions Assign the client to conduct family meetings regularly (e.g., possibly biweekly or monthly) with children (ages 54 and up) and partner to delegate household responsibilities. Objective Commit to a healthy eating, sleeping, and exercise routine. Target Date: 2023-08-05 Frequency: Biweekly  Progress: 0 Modality: individual   Objective Generate a list of self-care activities and make a commitment to regularly participate in such activities. Target Date: 2023-08-05 Frequency: Biweekly  Progress: 0 Modality: individual  7. Enhance ability to handle effectively life stressors. 8. Increase awareness of own role in relationship conflicts. 9. Increase overall sense of well-being via reduction/elimination of anxiety. 10. Maintain a balance between the multiple demands of motherhood, work, and other life roles and responsibilities. 11. Resolve issues involving low self-esteem or low self-efficacy that contribute to anxiety. Objective Identify precipitating events, thoughts, feelings, and reactions that are believed to contribute to anxiety. Target Date: 2023-08-05 Frequency: Biweekly  Progress: 0 Modality: individual  Related Interventions Educate the client on the relaxing, calming, and balancing benefits of practices such as yoga, meditation, and prayer; provide suggestions on how to get started (e.g., book, video, local gym class). Objective Implement self-care strategies that serve to augment the positive effects of other interventions. Target Date: 2023-08-05 Frequency: Biweekly  Progress: 0 Modality: individual  Related Interventions Elicit the client's specific patterns of thought that contribute to anxious states; aid client in understanding the connection between the identified maladaptive thoughts and anxious feelings. Objective Learn and practice methods of reducing anxiety in a variety of situations. Target Date: 2023-08-05 Frequency: Biweekly  Progress: 0 Modality: individual  Related Interventions Educate the client on thought-stopping techniques and positive reframing in order to preempt anxious reactions. Encourage the use of positive self-talk as a replacement to some of the automatic, negative self-talk being utilized currently. Objective Acknowledge underlying irrational or illogical thought  patterns that contribute to anxiety. Target Date: 2023-08-05 Frequency: Biweekly  Progress: 0 Modality: individual  Related Interventions Aid the client in seeing the connection between a focus on helping others or improving society and a reduction in anxiety (i.e., dwelling on maladaptive thoughts). Objective Begin to refer to an internal standard of performance, morals, achievement, appearance, and so on, rather than sociocultural, parental, spousal, or otherwise externally imposed judgments. Target Date: 2023-08-05 Frequency: Biweekly  Progress: 0 Modality: individual    Diagnosis:Adjustment disorder with mixed anxiety and depressed mood  Plan:  -meet again on Wednesday, August 14, 2022 at 3pm.

## 2022-08-05 NOTE — Patient Instructions (Addendum)
Increase metformin to '1000mg'$  twice daily.  Continue to work on dietary changes.  Follow up in about 3-4 months.

## 2022-08-05 NOTE — Assessment & Plan Note (Signed)
A1c has worsened since last visit, increasing metformin to 1000 mg twice daily.  Encouraged to work on dietary changes and incorporation of increased activity.  We did discuss considering GLP-1 to help with weight loss and better glucose control if this is still not improving.  F/u with Dr. Mel Almond in 3-4 months.

## 2022-08-05 NOTE — Progress Notes (Signed)
Mary Rocha - 46 y.o. female MRN [id number]  Date of birth: [birth year]  Subjective Chief Complaint  Patient presents with   Diabetes    HPI Mary Rocha is a 46 y.o. female here today for follow up of prediabetes.   Current management with metformin.  Doing well with tolerating this.  Her last A1c was 5.8%.  This has increase to 6.4% today.  She is hesitant to start GLP-1 due to family history of GI and pancreatic malignancies.  She is walking some, 3-4 days per week.  She has started doing meal prep for morning meals.  Evening meals are a little more disordered due to obligations to her daughter's sports throughout the week.    ROS:  A comprehensive ROS was completed and negative except as noted per HPI  No Known Allergies  Past Medical History:  Diagnosis Date   Allergy    AMA (advanced maternal age) multigravida 35+    Anemia    Back pain    Gestational diabetes    Headache(784.0)    High cholesterol    History of cholecystectomy    History of gestational diabetes 08/04/2018   Infertility, female    Lactose intolerance    Newborn product of IVF pregnancy    Ovarian cyst    Vitamin D deficiency     Past Surgical History:  Procedure Laterality Date   CESAREAN SECTION N/A 11/02/2013   Procedure: CESAREAN SECTION;  Surgeon: Darlyn Chamber, MD;  Location: [facility];  Service: Obstetrics;  Laterality: N/A;   CHOLECYSTECTOMY     TONSILLECTOMY      Social History   Socioeconomic History   Marital status: Married    Spouse name: Education administrator   Number of children: 1   Years of education: Not on file   Highest education level: Not on file  Occupational History   Occupation: SPEECH LANGUAGE PATHOLOGIST    Employer: [employer]  Tobacco Use   Smoking status: Never   Smokeless tobacco: Never  Vaping Use   Vaping Use: Never used  Substance and Sexual Activity   Alcohol use: Yes    Comment: 1 - 2 drinks / month   Drug use:  Never   Sexual activity: Yes    Partners: Male    Birth control/protection: None    Comment: Infertility  Other Topics Concern   Not on file  Social History Narrative   Not on file   Social Determinants of Health   Financial Resource Strain: Not on file  Food Insecurity: Not on file  Transportation Needs: Not on file  Physical Activity: Not on file  Stress: Not on file  Social Connections: Not on file    Family History  Problem Relation Age of Onset   Diabetes Maternal Aunt    Thyroid disease Maternal Grandmother    Pancreatic cancer Maternal Grandmother    Cancer Maternal Grandfather        lymphoma   Heart disease Paternal Grandfather    Cancer Paternal Grandfather        pancreatic   Pancreatic cancer Paternal Grandmother    Pancreatic cancer Paternal Aunt    Asthma Mother    High Cholesterol Father    Hyperlipidemia Father    Heart disease Father    Alcoholism Father    High Cholesterol Sister     Health Maintenance  Topic Date Due   Hepatitis C Screening  Never done   MAMMOGRAM  04/21/2019  COLONOSCOPY (Pts 45-44yr Insurance coverage will need to be confirmed)  Never done   COVID-19 Vaccine (4 - 2023-24 season) 09/26/2022 (Originally 04/26/2022)   PAP SMEAR-Modifier  04/21/2023   DTaP/Tdap/Td (3 - Td or Tdap) 02/07/2032   INFLUENZA VACCINE  Completed   HIV Screening  Completed   HPV VACCINES  Aged Out     ----------------------------------------------------------------------------------------------------------------------------------------------------------------------------------------------------------------- Physical Exam BP 126/83 (BP Location: Right Arm, Patient Position: Sitting, Cuff Size: Large)   Pulse 72   Ht '5\' 7"'$  (1.702 m)   Wt 255 lb (115.7 kg)   SpO2 97%   BMI 39.94 kg/m   Physical Exam Constitutional:      Appearance: Normal appearance.  Neurological:     Mental Status: She is alert.  Psychiatric:        Mood and Affect: Mood  normal.        Behavior: Behavior normal.     ------------------------------------------------------------------------------------------------------------------------------------------------------------------------------------------------------------------- Assessment and Plan  Prediabetes A1c has worsened since last visit, increasing metformin to 1000 mg twice daily.  Encouraged to work on dietary changes and incorporation of increased activity.  We did discuss considering GLP-1 to help with weight loss and better glucose control if this is still not improving.  F/u with Dr. WMel Almondin 3-4 months.    Meds ordered this encounter  Medications   metFORMIN (GLUCOPHAGE) 1000 MG tablet    Sig: Take 1 tablet (1,000 mg total) by mouth 2 (two) times daily with a meal.    Dispense:  180 tablet    Refill:  1    Return in about 4 months (around 12/05/2022) for F/u a1c-Schedule with PCP.    This visit occurred during the SARS-CoV-2 public health emergency.  Safety protocols were in place, including screening questions prior to the visit, additional usage of staff PPE, and extensive cleaning of exam room while observing appropriate contact time as indicated for disinfecting solutions.

## 2022-08-06 ENCOUNTER — Encounter: Payer: Self-pay | Admitting: Professional

## 2022-08-12 DIAGNOSIS — F4323 Adjustment disorder with mixed anxiety and depressed mood: Secondary | ICD-10-CM | POA: Insufficient documentation

## 2022-08-14 ENCOUNTER — Ambulatory Visit (INDEPENDENT_AMBULATORY_CARE_PROVIDER_SITE_OTHER): Payer: 59 | Admitting: Professional

## 2022-08-14 ENCOUNTER — Encounter: Payer: Self-pay | Admitting: Professional

## 2022-08-14 DIAGNOSIS — F4323 Adjustment disorder with mixed anxiety and depressed mood: Secondary | ICD-10-CM | POA: Diagnosis not present

## 2022-08-14 NOTE — Progress Notes (Addendum)
[place] Counselor/Therapist Progress Note  Patient ID: Mary Rocha, MRN: [id number],    Date: 08/14/2022  Time Spent: 55 minutes [id number]  Treatment Type: Individual Therapy  Risk Assessment: Danger to Self:  No Self-injurious Behavior: No Danger to Others: No  Subjective: This session was held via video teletherapy. The patient consented to video teletherapy and was located at her home during this session. She is aware it is the responsibility of the patient to secure confidentiality on her end of the session. The provider was in a private home office for the duration of this session.    The patient arrived late for her webex appointment.   Issues: 1-relationships a-Chris willing to get marital therapy b-patient's father and husband are very similar -they are both unable to accept criticism -pt's go-to reaction is sarcasm -as a child was very much disregarded -Cristie Hem and Carla Drape were favored by father -stepmother made it very clear that the patient and her sister were on the outside of the family -Elmyra Ricks and sister always included in her mother/stepfather's family -one of the things that Gerald Stabs is most annoyed by -it is her "got to" defense -pt's father still treats her less than -she borrowed money to cut down a tree and he gave them less at Christmas   -he announced at Christmas dinner that she and her spouse got less money because he had given money -her wedding gown was a birthday present, however, both her sister and half sister got theirs paid for -her father's ex-wife still is in her life -Gerald Stabs is always very clear that they are family 2-marital -would want more equality -more give and take -in an ideal world she would want more empathy for "mental loads" -he doesn't understand it so he can't relate to it -when they first dated they were very emotionally connected -Gerald Stabs was flowers or little gifts for everything -he was very attentive and  did things that really didn't interest him -pt thinks he likes that she can take care of him -initially thinks he liked the idea that she had a big family   -as he learned more about her family the only one that he still talks to is her stepfather       2023 Treatment Plan Problems Addressed  Anxiety, Balancing Work and Family/Multiple Roles, Depression, Partner Relational Problems  Goals 1. Address and eliminate maladaptive thought processes that lead to anxious responses. 2. Develop a realistic perspective regarding demands and obligations of multiple roles and their completion. 3. Develop a sense of own personal power and self-esteem within the relationship. 4. Develop healthy cognitive mechanisms to facilitate positive attitudes and beliefs about self within the context of one's environment to mitigate depressive symptoms. Objective Articulate self-care methods to prevent and/or manage depression in the future; create a list of 10 coping methods for future use. Target Date: 2023-08-05 Frequency: Biweekly  Progress: 0 Modality: individual  Objective Increase the level of physical exercise. Target Date: 2023-08-05 Frequency: Biweekly  Progress: 0 Modality: individual  Objective Implement assertiveness to communicate needs and desires to others. Target Date: 2023-08-05 Frequency: Biweekly  Progress: 0 Modality: individual  Related Interventions Offer the client booster sessions as necessary. Objective Increase the frequency of engaging in pleasant activities. Target Date: 2023-08-05 Frequency: Biweekly  Progress: 0 Modality: individual  Related Interventions Empower the client to connect with a positive female role model through bibliotherapy and/or with family members, friends, or Regulatory affairs officer; review how the client may apply the adaptive behaviors  of the role model to her own life. Objective Identify and replace cognitive self-talk that supports depression. Target  Date: 2023-08-05 Frequency: Biweekly  Progress: 0 Modality: individual  Related Interventions Do "behavioral experiments" in which depressive automatic thoughts are treated as hypotheses/predictions, reality-based alternative hypotheses/ predictions are generated, and both are tested against the client's past, present, and/or future experiences. Encourage the client to discuss cognitive distortions, including automatic thoughts (e.g., negative view of self, future, experience) and negative schemas (e.g., core beliefs about self and others based on earlier childhood experiences); assess frequency of negative self-statements associated with depression. Reinforce the client's positive, reality-based cognitive messages that enhance self-confidence and increase adaptive action (see "Positive Self-Talk" in the Adult Psychotherapy Homework Planner, 2nd ed. by Bryn Gulling). Objective Articulate signs and symptoms of depression in current life experiences. Target Date: 2023-08-05 Frequency: Biweekly  Progress: 0 Modality: individual  Related Interventions Encourage the client to share her feelings of depression to gain an insight into precipitating events and implications of symptoms; normalize her feelings of depression. Encourage the client to describe current and childhood experiences associated with key relationships (e.g., family-of-origin, peer and school relationships, dating relationships, female role models, extended family relationships), roles (e.g., partner, caretaker, worker, Ship broker), and cultural experiences (e.g., gender roles) that may contribute to depression. 5. Develop the necessary skills for effective, open communication and mutually satisfying intimacy. Objective Each partner identifies her/his own role in the conflicts. Target Date: 2023-08-05 Frequency: Biweekly  Progress: 0 Modality: individual  Related Interventions Assign the couple to set aside daily 15 minutes that are distraction  free during which they can communicate about nonconflictual issues; practice during the therapy session, assisting each partner in clarifying communication and expression of feelings. Seek a commitment from each partner to begin to work on changing specific behaviors on her/his list and on the list of the partner for her/him. Objective Increase the frequency and quality of communication with the partner. Target Date: 2023-08-05 Frequency: Biweekly  Progress: 0 Modality: individual  Related Interventions Validate the client's disappointment and disillusionment with the current relationship; assist the client with expressing her expectations and hopes regarding the relationship. Objective Express thoughts and feelings regarding the relationship in a direct manner. Target Date: 2023-08-05 Frequency: Biweekly  Progress: 0 Modality: individual  Related Interventions Use behavioral techniques (e.g., education, modeling, role-playing, corrective feedback, positive reinforcement) to teach the couple problem-solving and conflict-resolution skills, including defining the problem constructively and specifically, brainstorming options, compromise, choosing options, implementing a plan, and evaluating the results. Objective Both partners agree to a "time out" signal that either partner may give to stop interaction that may escalate into abuse. Target Date: 2023-08-05 Frequency: Biweekly  Progress: 0 Modality: individual  Objective Increase time spent in enjoyable contact with the partner. Target Date: 2023-08-05 Frequency: Biweekly  Progress: 0 Modality: individual  6. Eliminate depressive and anxiety symptoms associated with trying to balance multiple roles. Objective Describe role and responsibilities associated with work and family and related thoughts, feelings, and behaviors. Target Date: 2023-08-05 Frequency: Biweekly  Progress: 0 Modality: individual  Related Interventions Explore with the  client her multiple roles and responsibilities associated with work and family; clarify related thoughts and feelings. Objective Clarify values and priorities. Target Date: 2023-08-05 Frequency: Biweekly  Progress: 0 Modality: individual  Related Interventions Assign the client to read about progressive muscle relaxation and other calming strategies in relevant books or treatment manuals (e.g., Progressive Relaxation Training by Leroy Kennedy; Mastery of Your Anxiety and Conservator, museum/gallery by Corky Mull,  Luretha Rued). Refer the client to initiate conjoint therapy with partner. Objective Implement assertiveness skills and limit setting. Target Date: 2023-08-05 Frequency: Biweekly  Progress: 0 Modality: individual  Related Interventions Use behavioral techniques (i.e., education, modeling, role-playing, corrective feedback, positive reinforcement) to teach communication skills, including assertive communication, offering positive feedback, active listening, making positive requests of others for behavior change, and giving negative feedback in an honest and respectful manner. Objective Learn and implement stress management and relaxation techniques to reduce fatigue, anxiety, and depressive symptoms. Target Date: 2023-08-05 Frequency: Biweekly  Progress: 0 Modality: individual  Related Interventions Explore, along with the client, strategies for making her life more manageable and less stressful (e.g., waking up before the children to exercise or have a cup of tea, pack the children's lunches and backpacks the night before). Objective Communicate needs with partner regarding multiple role obligations. Target Date: 2023-08-05 Frequency: Biweekly  Progress: 0 Modality: individual  Related Interventions Help the client to develop a realistic schedule that outlines the responsibilities of her partner and family members; help the client enlist the commitment of all individuals  to the schedule. Objective Learn and implement problem-solving and conflict-resolution skills. Target Date: 2023-08-05 Frequency: Biweekly  Progress: 0 Modality: individual  Related Interventions Assign the client to conduct family meetings regularly (e.g., possibly biweekly or monthly) with children (ages 66 and up) and partner to delegate household responsibilities. Objective Commit to a healthy eating, sleeping, and exercise routine. Target Date: 2023-08-05 Frequency: Biweekly  Progress: 0 Modality: individual  Objective Generate a list of self-care activities and make a commitment to regularly participate in such activities. Target Date: 2023-08-05 Frequency: Biweekly  Progress: 0 Modality: individual  7. Enhance ability to handle effectively life stressors. 8. Increase awareness of own role in relationship conflicts. 9. Increase overall sense of well-being via reduction/elimination of anxiety. 10. Maintain a balance between the multiple demands of motherhood, work, and other life roles and responsibilities. 11. Resolve issues involving low self-esteem or low self-efficacy that contribute to anxiety. Objective Identify precipitating events, thoughts, feelings, and reactions that are believed to contribute to anxiety. Target Date: 2023-08-05 Frequency: Biweekly  Progress: 0 Modality: individual  Related Interventions Educate the client on the relaxing, calming, and balancing benefits of practices such as yoga, meditation, and prayer; provide suggestions on how to get started (e.g., book, video, local gym class). Objective Implement self-care strategies that serve to augment the positive effects of other interventions. Target Date: 2023-08-05 Frequency: Biweekly  Progress: 0 Modality: individual  Related Interventions Elicit the client's specific patterns of thought that contribute to anxious states; aid client in understanding the connection between the identified maladaptive  thoughts and anxious feelings. Objective Learn and practice methods of reducing anxiety in a variety of situations. Target Date: 2023-08-05 Frequency: Biweekly  Progress: 0 Modality: individual  Related Interventions Educate the client on thought-stopping techniques and positive reframing in order to preempt anxious reactions. Encourage the use of positive self-talk as a replacement to some of the automatic, negative self-talk being utilized currently. Objective Acknowledge underlying irrational or illogical thought patterns that contribute to anxiety. Target Date: 2023-08-05 Frequency: Biweekly  Progress: 0 Modality: individual  Related Interventions Aid the client in seeing the connection between a focus on helping others or improving society and a reduction in anxiety (i.e., dwelling on maladaptive thoughts). Objective Begin to refer to an internal standard of performance, morals, achievement, appearance, and so on, rather than sociocultural, parental, spousal, or otherwise externally imposed judgments. Target Date: 2023-08-05 Frequency: Biweekly  Progress: 0 Modality: individual    Diagnosis:Adjustment disorder with mixed anxiety and depressed mood  Plan:  -consider how your mother in law Deb could assist you -meet again on Wednesday, September 04, 2022 at 11am.

## 2022-09-04 ENCOUNTER — Encounter: Payer: Self-pay | Admitting: Professional

## 2022-09-04 ENCOUNTER — Ambulatory Visit (INDEPENDENT_AMBULATORY_CARE_PROVIDER_SITE_OTHER): Payer: 59 | Admitting: Professional

## 2022-09-04 DIAGNOSIS — F4323 Adjustment disorder with mixed anxiety and depressed mood: Secondary | ICD-10-CM | POA: Diagnosis not present

## 2022-09-04 NOTE — Progress Notes (Addendum)
[facility] Counselor/Therapist Progress Note  Patient ID: Mary Rocha, MRN: [id number],    Date: 09/04/2022  Time Spent: 56 minutes 1102-1158am  Treatment Type: Individual Therapy  Risk Assessment: Danger to Self:  No Self-injurious Behavior: No Danger to Others: No  Subjective: This session was held via video teletherapy. The patient consented to video teletherapy and was located at her home during this session. She is aware it is the responsibility of the patient to secure confidentiality on her end of the session. The provider was in a private home office for the duration of this session.    The patient arrived late for her webex appointment.   Issues: 1-homework- completed -consider how your mother in law Mary Rocha could assist you -mother in law will begin picking up Mary Rocha on Tuesdays next week and see how that works 2-Relationships a-brother Mary Rocha -has mental health and substance issues -really wants contact with family but does not follow through b-parents, sister Mary Rocha and brother  -they have decided to set boundaries with Mary Rocha declined contact for nearly a year due to     a-Mary Rocha willing to get marital therapy b-patient's father and husband are very similar -they are both unable to accept criticism -pt's go-to reaction is sarcasm -as a child was very much disregarded -Mary Rocha and Mary Rocha were favored by father -stepmother made it very clear that the patient and her sister were on the outside of the family -Mary Rocha and sister always included in her mother/stepfather's family -one of the things that Mary Rocha is most annoyed by -it is her "got to" defense -pt's father still treats her less than -she borrowed money to cut down a tree and he gave them less at Christmas   -he announced at Christmas dinner that she and her spouse got less money because he had given money -her wedding gown was a birthday present, however, both her sister and half  sister got theirs paid for -her father's ex-wife still is in her life -Mary Rocha is always very clear that they are family 2-marital -would want more equality -more give and take -in an ideal world she would want more empathy for "mental loads" -he doesn't understand it so he can't relate to it -when they first dated they were very emotionally connected -Mary Rocha was flowers or little gifts for everything -he was very attentive and did things that really didn't interest him -pt thinks he likes that she can take care of him -initially thinks he liked the idea that she had a big family   -as he learned more about her family the only one that he still talks to is her stepfather       2023 Treatment Plan Problems Addressed  Anxiety, Balancing Work and Family/Multiple Roles, Depression, Partner Relational Problems  Goals 1. Address and eliminate maladaptive thought processes that lead to anxious responses. 2. Develop a realistic perspective regarding demands and obligations of multiple roles and their completion. 3. Develop a sense of own personal power and self-esteem within the relationship. 4. Develop healthy cognitive mechanisms to facilitate positive attitudes and beliefs about self within the context of one's environment to mitigate depressive symptoms. Objective Articulate self-care methods to prevent and/or manage depression in the future; create a list of 10 coping methods for future use. Target Date: 2023-08-05 Frequency: Biweekly  Progress: 0 Modality: individual  Objective Increase the level of physical exercise. Target Date: 2023-08-05 Frequency: Biweekly  Progress: 0 Modality: individual  Objective Implement assertiveness to communicate needs and desires  to others. Target Date: 2023-08-05 Frequency: Biweekly  Progress: 0 Modality: individual  Related Interventions Offer the client booster sessions as necessary. Objective Increase the frequency of engaging in pleasant  activities. Target Date: 2023-08-05 Frequency: Biweekly  Progress: 0 Modality: individual  Related Interventions Empower the client to connect with a positive female role model through bibliotherapy and/or with family members, friends, or Regulatory affairs officer; review how the client may apply the adaptive behaviors of the role model to her own life. Objective Identify and replace cognitive self-talk that supports depression. Target Date: 2023-08-05 Frequency: Biweekly  Progress: 0 Modality: individual  Related Interventions Do "behavioral experiments" in which depressive automatic thoughts are treated as hypotheses/predictions, reality-based alternative hypotheses/ predictions are generated, and both are tested against the client's past, present, and/or future experiences. Encourage the client to discuss cognitive distortions, including automatic thoughts (e.g., negative view of self, future, experience) and negative schemas (e.g., core beliefs about self and others based on earlier childhood experiences); assess frequency of negative self-statements associated with depression. Reinforce the client's positive, reality-based cognitive messages that enhance self-confidence and increase adaptive action (see "Positive Self-Talk" in the Adult Psychotherapy Homework Planner, 2nd ed. by Bryn Gulling). Objective Articulate signs and symptoms of depression in current life experiences. Target Date: 2023-08-05 Frequency: Biweekly  Progress: 0 Modality: individual  Related Interventions Encourage the client to share her feelings of depression to gain an insight into precipitating events and implications of symptoms; normalize her feelings of depression. Encourage the client to describe current and childhood experiences associated with key relationships (e.g., family-of-origin, peer and school relationships, dating relationships, female role models, extended family relationships), roles (e.g., partner, caretaker,  worker, Ship broker), and cultural experiences (e.g., gender roles) that may contribute to depression. 5. Develop the necessary skills for effective, open communication and mutually satisfying intimacy. Objective Each partner identifies her/his own role in the conflicts. Target Date: 2023-08-05 Frequency: Biweekly  Progress: 0 Modality: individual  Related Interventions Assign the couple to set aside daily 15 minutes that are distraction free during which they can communicate about nonconflictual issues; practice during the therapy session, assisting each partner in clarifying communication and expression of feelings. Seek a commitment from each partner to begin to work on changing specific behaviors on her/his list and on the list of the partner for her/him. Objective Increase the frequency and quality of communication with the partner. Target Date: 2023-08-05 Frequency: Biweekly  Progress: 0 Modality: individual  Related Interventions Validate the client's disappointment and disillusionment with the current relationship; assist the client with expressing her expectations and hopes regarding the relationship. Objective Express thoughts and feelings regarding the relationship in a direct manner. Target Date: 2023-08-05 Frequency: Biweekly  Progress: 0 Modality: individual  Related Interventions Use behavioral techniques (e.g., education, modeling, role-playing, corrective feedback, positive reinforcement) to teach the couple problem-solving and conflict-resolution skills, including defining the problem constructively and specifically, brainstorming options, compromise, choosing options, implementing a plan, and evaluating the results. Objective Both partners agree to a "time out" signal that either partner may give to stop interaction that may escalate into abuse. Target Date: 2023-08-05 Frequency: Biweekly  Progress: 0 Modality: individual  Objective Increase time spent in enjoyable contact  with the partner. Target Date: 2023-08-05 Frequency: Biweekly  Progress: 0 Modality: individual  6. Eliminate depressive and anxiety symptoms associated with trying to balance multiple roles. Objective Describe role and responsibilities associated with work and family and related thoughts, feelings, and behaviors. Target Date: 2023-08-05 Frequency: Biweekly  Progress: 0 Modality: individual  Related Interventions  Explore with the client her multiple roles and responsibilities associated with work and family; clarify related thoughts and feelings. Objective Clarify values and priorities. Target Date: 2023-08-05 Frequency: Biweekly  Progress: 0 Modality: individual  Related Interventions Assign the client to read about progressive muscle relaxation and other calming strategies in relevant books or treatment manuals (e.g., Progressive Relaxation Training by Leroy Kennedy; Mastery of Your Anxiety and Conservator, museum/gallery by Birdie Hopes). Refer the client to initiate conjoint therapy with partner. Objective Implement assertiveness skills and limit setting. Target Date: 2023-08-05 Frequency: Biweekly  Progress: 0 Modality: individual  Related Interventions Use behavioral techniques (i.e., education, modeling, role-playing, corrective feedback, positive reinforcement) to teach communication skills, including assertive communication, offering positive feedback, active listening, making positive requests of others for behavior change, and giving negative feedback in an honest and respectful manner. Objective Learn and implement stress management and relaxation techniques to reduce fatigue, anxiety, and depressive symptoms. Target Date: 2023-08-05 Frequency: Biweekly  Progress: 0 Modality: individual  Related Interventions Explore, along with the client, strategies for making her life more manageable and less stressful (e.g., waking up before the children to exercise  or have a cup of tea, pack the children's lunches and backpacks the night before). Objective Communicate needs with partner regarding multiple role obligations. Target Date: 2023-08-05 Frequency: Biweekly  Progress: 0 Modality: individual  Related Interventions Help the client to develop a realistic schedule that outlines the responsibilities of her partner and family members; help the client enlist the commitment of all individuals to the schedule. Objective Learn and implement problem-solving and conflict-resolution skills. Target Date: 2023-08-05 Frequency: Biweekly  Progress: 0 Modality: individual  Related Interventions Assign the client to conduct family meetings regularly (e.g., possibly biweekly or monthly) with children (ages 61 and up) and partner to delegate household responsibilities. Objective Commit to a healthy eating, sleeping, and exercise routine. Target Date: 2023-08-05 Frequency: Biweekly  Progress: 0 Modality: individual  Objective Generate a list of self-care activities and make a commitment to regularly participate in such activities. Target Date: 2023-08-05 Frequency: Biweekly  Progress: 0 Modality: individual  7. Enhance ability to handle effectively life stressors. 8. Increase awareness of own role in relationship conflicts. 9. Increase overall sense of well-being via reduction/elimination of anxiety. 10. Maintain a balance between the multiple demands of motherhood, work, and other life roles and responsibilities. 11. Resolve issues involving low self-esteem or low self-efficacy that contribute to anxiety. Objective Identify precipitating events, thoughts, feelings, and reactions that are believed to contribute to anxiety. Target Date: 2023-08-05 Frequency: Biweekly  Progress: 0 Modality: individual  Related Interventions Educate the client on the relaxing, calming, and balancing benefits of practices such as yoga, meditation, and prayer; provide suggestions  on how to get started (e.g., book, video, local gym class). Objective Implement self-care strategies that serve to augment the positive effects of other interventions. Target Date: 2023-08-05 Frequency: Biweekly  Progress: 0 Modality: individual  Related Interventions Elicit the client's specific patterns of thought that contribute to anxious states; aid client in understanding the connection between the identified maladaptive thoughts and anxious feelings. Objective Learn and practice methods of reducing anxiety in a variety of situations. Target Date: 2023-08-05 Frequency: Biweekly  Progress: 0 Modality: individual  Related Interventions Educate the client on thought-stopping techniques and positive reframing in order to preempt anxious reactions. Encourage the use of positive self-talk as a replacement to some of the automatic, negative self-talk being utilized currently. Objective Acknowledge underlying irrational or illogical thought patterns  that contribute to anxiety. Target Date: 2023-08-05 Frequency: Biweekly  Progress: 0 Modality: individual  Related Interventions Aid the client in seeing the connection between a focus on helping others or improving society and a reduction in anxiety (i.e., dwelling on maladaptive thoughts). Objective Begin to refer to an internal standard of performance, morals, achievement, appearance, and so on, rather than sociocultural, parental, spousal, or otherwise externally imposed judgments. Target Date: 2023-08-05 Frequency: Biweekly  Progress: 0 Modality: individual    Diagnosis:Adjustment disorder with mixed anxiety and depressed mood  Plan:  -continue reading the Boundaries book -continue working on relationship with Mary Rocha -meet again on Thursday, September 19, 2022 at 1pm in-person.

## 2022-09-19 ENCOUNTER — Encounter: Payer: Self-pay | Admitting: Professional

## 2022-09-19 ENCOUNTER — Ambulatory Visit (INDEPENDENT_AMBULATORY_CARE_PROVIDER_SITE_OTHER): Payer: 59 | Admitting: Professional

## 2022-09-19 DIAGNOSIS — F4323 Adjustment disorder with mixed anxiety and depressed mood: Secondary | ICD-10-CM

## 2022-09-19 NOTE — Progress Notes (Signed)
[facility] Counselor/Therapist Progress Note  Patient ID: Mary Rocha, MRN: [id number],    Date: 09/04/2022  Time Spent: 52 minutes [id number]  Treatment Type: Individual Therapy  Risk Assessment: Danger to Self:  No Self-injurious Behavior: No Danger to Others: No  Subjective: This session was held via video teletherapy. The patient consented to video teletherapy and was located at her home during this session. She is aware it is the responsibility of the patient to secure confidentiality on her end of the session. The provider was in a private home office for the duration of this session.    The patient arrived on time for her webex appointment.   Issues: 1-homework- completed a-continue reading the Boundaries book b-continue working on relationship with Gerald Stabs 2-boundaries a-likes setting boundaries b-knows she needs to dig into the book more c-her dad has responded well to her boundaries d-Chris responds depending upon what it is about -daughter going to his mother Debs house on Tuesdays -Gerald Stabs gets frustrated with her when she is not ready when he expects -she warned Gerald Stabs when he began yelling that she would hang up the phone -he continued when he got home but then once calm he was apologetic e-two major issues -Gerald Stabs can never admits a mistake  2023 Treatment Plan Problems Addressed  Anxiety, Balancing Work and Family/Multiple Roles, Depression, Partner Relational Problems  Goals 1. Address and eliminate maladaptive thought processes that lead to anxious responses. 2. Develop a realistic perspective regarding demands and obligations of multiple roles and their completion. 3. Develop a sense of own personal power and self-esteem within the relationship. 4. Develop healthy cognitive mechanisms to facilitate positive attitudes and beliefs about self within the context of one's environment to mitigate depressive symptoms. Objective Articulate  self-care methods to prevent and/or manage depression in the future; create a list of 10 coping methods for future use. Target Date: 2023-08-05 Frequency: Biweekly  Progress: 0 Modality: individual  Objective Increase the level of physical exercise. Target Date: 2023-08-05 Frequency: Biweekly  Progress: 0 Modality: individual  Objective Implement assertiveness to communicate needs and desires to others. Target Date: 2023-08-05 Frequency: Biweekly  Progress: 0 Modality: individual  Related Interventions Offer the client booster sessions as necessary. Objective Increase the frequency of engaging in pleasant activities. Target Date: 2023-08-05 Frequency: Biweekly  Progress: 0 Modality: individual  Related Interventions Empower the client to connect with a positive female role model through bibliotherapy and/or with family members, friends, or Regulatory affairs officer; review how the client may apply the adaptive behaviors of the role model to her own life. Objective Identify and replace cognitive self-talk that supports depression. Target Date: 2023-08-05 Frequency: Biweekly  Progress: 0 Modality: individual  Related Interventions Do "behavioral experiments" in which depressive automatic thoughts are treated as hypotheses/predictions, reality-based alternative hypotheses/ predictions are generated, and both are tested against the client's past, present, and/or future experiences. Encourage the client to discuss cognitive distortions, including automatic thoughts (e.g., negative view of self, future, experience) and negative schemas (e.g., core beliefs about self and others based on earlier childhood experiences); assess frequency of negative self-statements associated with depression. Reinforce the client's positive, reality-based cognitive messages that enhance self-confidence and increase adaptive action (see "Positive Self-Talk" in the Adult Psychotherapy Homework Planner, 2nd ed. by  Bryn Gulling). Objective Articulate signs and symptoms of depression in current life experiences. Target Date: 2023-08-05 Frequency: Biweekly  Progress: 0 Modality: individual  Related Interventions Encourage the client to share her feelings of depression to gain an insight into precipitating  events and implications of symptoms; normalize her feelings of depression. Encourage the client to describe current and childhood experiences associated with key relationships (e.g., family-of-origin, peer and school relationships, dating relationships, female role models, extended family relationships), roles (e.g., partner, caretaker, worker, Ship broker), and cultural experiences (e.g., gender roles) that may contribute to depression. 5. Develop the necessary skills for effective, open communication and mutually satisfying intimacy. Objective Each partner identifies her/his own role in the conflicts. Target Date: 2023-08-05 Frequency: Biweekly  Progress: 0 Modality: individual  Related Interventions Assign the couple to set aside daily 15 minutes that are distraction free during which they can communicate about nonconflictual issues; practice during the therapy session, assisting each partner in clarifying communication and expression of feelings. Seek a commitment from each partner to begin to work on changing specific behaviors on her/his list and on the list of the partner for her/him. Objective Increase the frequency and quality of communication with the partner. Target Date: 2023-08-05 Frequency: Biweekly  Progress: 0 Modality: individual  Related Interventions Validate the client's disappointment and disillusionment with the current relationship; assist the client with expressing her expectations and hopes regarding the relationship. Objective Express thoughts and feelings regarding the relationship in a direct manner. Target Date: 2023-08-05 Frequency: Biweekly  Progress: 0 Modality: individual   Related Interventions Use behavioral techniques (e.g., education, modeling, role-playing, corrective feedback, positive reinforcement) to teach the couple problem-solving and conflict-resolution skills, including defining the problem constructively and specifically, brainstorming options, compromise, choosing options, implementing a plan, and evaluating the results. Objective Both partners agree to a "time out" signal that either partner may give to stop interaction that may escalate into abuse. Target Date: 2023-08-05 Frequency: Biweekly  Progress: 0 Modality: individual  Objective Increase time spent in enjoyable contact with the partner. Target Date: 2023-08-05 Frequency: Biweekly  Progress: 0 Modality: individual  6. Eliminate depressive and anxiety symptoms associated with trying to balance multiple roles. Objective Describe role and responsibilities associated with work and family and related thoughts, feelings, and behaviors. Target Date: 2023-08-05 Frequency: Biweekly  Progress: 0 Modality: individual  Related Interventions Explore with the client her multiple roles and responsibilities associated with work and family; clarify related thoughts and feelings. Objective Clarify values and priorities. Target Date: 2023-08-05 Frequency: Biweekly  Progress: 0 Modality: individual  Related Interventions Assign the client to read about progressive muscle relaxation and other calming strategies in relevant books or treatment manuals (e.g., Progressive Relaxation Training by Leroy Kennedy; Mastery of Your Anxiety and Conservator, museum/gallery by Birdie Hopes). Refer the client to initiate conjoint therapy with partner. Objective Implement assertiveness skills and limit setting. Target Date: 2023-08-05 Frequency: Biweekly  Progress: 0 Modality: individual  Related Interventions Use behavioral techniques (i.e., education, modeling, role-playing, corrective  feedback, positive reinforcement) to teach communication skills, including assertive communication, offering positive feedback, active listening, making positive requests of others for behavior change, and giving negative feedback in an honest and respectful manner. Objective Learn and implement stress management and relaxation techniques to reduce fatigue, anxiety, and depressive symptoms. Target Date: 2023-08-05 Frequency: Biweekly  Progress: 0 Modality: individual  Related Interventions Explore, along with the client, strategies for making her life more manageable and less stressful (e.g., waking up before the children to exercise or have a cup of tea, pack the children's lunches and backpacks the night before). Objective Communicate needs with partner regarding multiple role obligations. Target Date: 2023-08-05 Frequency: Biweekly  Progress: 0 Modality: individual  Related Interventions Help the client to develop  a realistic schedule that outlines the responsibilities of her partner and family members; help the client enlist the commitment of all individuals to the schedule. Objective Learn and implement problem-solving and conflict-resolution skills. Target Date: 2023-08-05 Frequency: Biweekly  Progress: 0 Modality: individual  Related Interventions Assign the client to conduct family meetings regularly (e.g., possibly biweekly or monthly) with children (ages 75 and up) and partner to delegate household responsibilities. Objective Commit to a healthy eating, sleeping, and exercise routine. Target Date: 2023-08-05 Frequency: Biweekly  Progress: 0 Modality: individual  Objective Generate a list of self-care activities and make a commitment to regularly participate in such activities. Target Date: 2023-08-05 Frequency: Biweekly  Progress: 0 Modality: individual  7. Enhance ability to handle effectively life stressors. 8. Increase awareness of own role in relationship conflicts. 9.  Increase overall sense of well-being via reduction/elimination of anxiety. 10. Maintain a balance between the multiple demands of motherhood, work, and other life roles and responsibilities. 11. Resolve issues involving low self-esteem or low self-efficacy that contribute to anxiety. Objective Identify precipitating events, thoughts, feelings, and reactions that are believed to contribute to anxiety. Target Date: 2023-08-05 Frequency: Biweekly  Progress: 0 Modality: individual  Related Interventions Educate the client on the relaxing, calming, and balancing benefits of practices such as yoga, meditation, and prayer; provide suggestions on how to get started (e.g., book, video, local gym class). Objective Implement self-care strategies that serve to augment the positive effects of other interventions. Target Date: 2023-08-05 Frequency: Biweekly  Progress: 0 Modality: individual  Related Interventions Elicit the client's specific patterns of thought that contribute to anxious states; aid client in understanding the connection between the identified maladaptive thoughts and anxious feelings. Objective Learn and practice methods of reducing anxiety in a variety of situations. Target Date: 2023-08-05 Frequency: Biweekly  Progress: 0 Modality: individual  Related Interventions Educate the client on thought-stopping techniques and positive reframing in order to preempt anxious reactions. Encourage the use of positive self-talk as a replacement to some of the automatic, negative self-talk being utilized currently. Objective Acknowledge underlying irrational or illogical thought patterns that contribute to anxiety. Target Date: 2023-08-05 Frequency: Biweekly  Progress: 0 Modality: individual  Related Interventions Aid the client in seeing the connection between a focus on helping others or improving society and a reduction in anxiety (i.e., dwelling on maladaptive thoughts). Objective Begin to  refer to an internal standard of performance, morals, achievement, appearance, and so on, rather than sociocultural, parental, spousal, or otherwise externally imposed judgments. Target Date: 2023-08-05 Frequency: Biweekly  Progress: 0 Modality: individual    Diagnosis:Adjustment disorder with mixed anxiety and depressed mood  Plan:  -complete values self-exploration -meet again on Tuesday, October 01, 2022 at [employer].Marland Kitchen

## 2022-10-01 ENCOUNTER — Encounter: Payer: Self-pay | Admitting: Professional

## 2022-10-01 ENCOUNTER — Ambulatory Visit (INDEPENDENT_AMBULATORY_CARE_PROVIDER_SITE_OTHER): Payer: 59 | Admitting: Professional

## 2022-10-01 DIAGNOSIS — F4323 Adjustment disorder with mixed anxiety and depressed mood: Secondary | ICD-10-CM

## 2022-10-01 NOTE — Progress Notes (Signed)
[facility] Counselor/Therapist Progress Note  Patient ID: Mary Rocha, MRN: [id number],    Date: 10/01/2022  Time Spent: 47 minutes 103-150pm  Treatment Type: Individual Therapy  Risk Assessment: Danger to Self:  No Self-injurious Behavior: No Danger to Others: No  Subjective: This session was held via video teletherapy. The patient consented to video teletherapy and was located at her home during this session. She is aware it is the responsibility of the patient to secure confidentiality on her end of the session. The provider was in a private home office for the duration of this session.    The patient arrived on time for her webex appointment.   Issues: 1-homework- completed -complete values self-exploration 2-review treatment progress -reviewed measurable objectives -pt has made progress in all areas -pt admits she has invested greatly in her self-care but not so much in couples issues -reminded pt that since she has engaged her self-care she is now ready for marital work  [place] (reviewed 10/01/2022) Problems Addressed  Anxiety, Balancing Work and Family/Multiple Roles, Depression, Partner Relational Problems  Goals 1. Address and eliminate maladaptive thought processes that lead to anxious responses. 2. Develop a realistic perspective regarding demands and obligations of multiple roles and their completion. 3. Develop a sense of own personal power and self-esteem within the relationship. 4. Develop healthy cognitive mechanisms to facilitate positive attitudes and beliefs about self within the context of one's environment to mitigate depressive symptoms. Objective Articulate self-care methods to prevent and/or manage depression in the future; create a list of 10 coping methods for future use. Target Date: 2023-08-05 Frequency: Biweekly  Progress: 70 (gf time, coffee dates, MIL pick up Mary Rocha, carpool, exercise, time w Mary Rocha) Modality:  individual  Objective Increase the level of physical exercise. Target Date: 2023-08-05 Frequency: Biweekly  Progress: 80 Modality: individual  Objective Implement assertiveness to communicate needs and desires to others. Target Date: 2023-08-05 Frequency: Biweekly  Progress: 40 (certain people, primarily Mary Rocha, she doesn't know how to deal with frustration with having the conversation) Modality: individual  Related Interventions Offer the client booster sessions as necessary. Objective Increase the frequency of engaging in pleasant activities. Target Date: 2023-08-05 Frequency: Biweekly  Progress: 75 Modality: individual  Related Interventions Empower the client to connect with a positive female role model through bibliotherapy and/or with family members, friends, or Regulatory affairs officer; review how the client may apply the adaptive behaviors of the role model to her own life. Objective Identify and replace cognitive self-talk that supports depression. Target Date: 2023-08-05 Frequency: Biweekly  Progress: 55 Modality: individual  Related Interventions Do "behavioral experiments" in which depressive automatic thoughts are treated as hypotheses/predictions, reality-based alternative hypotheses/ predictions are generated, and both are tested against the client's past, present, and/or future experiences. Encourage the client to discuss cognitive distortions, including automatic thoughts (e.g., negative view of self, future, experience) and negative schemas (e.g., core beliefs about self and others based on earlier childhood experiences); assess frequency of negative self-statements associated with depression. Reinforce the client's positive, reality-based cognitive messages that enhance self-confidence and increase adaptive action (see "Positive Self-Talk" in the Adult Psychotherapy Homework Planner, 2nd ed. by Bryn Gulling). Objective Articulate signs and symptoms of depression in current life  experiences. Target Date: 2023-08-05 Frequency: Biweekly  Progress: 35 Modality: individual  Related Interventions Encourage the client to share her feelings of depression to gain an insight into precipitating events and implications of symptoms; normalize her feelings of depression. Encourage the client to describe current and childhood experiences  associated with key relationships (e.g., family-of-origin, peer and school relationships, dating relationships, female role models, extended family relationships), roles (e.g., partner, caretaker, worker, Ship broker), and cultural experiences (e.g., gender roles) that may contribute to depression. 5. Develop the necessary skills for effective, open communication and mutually satisfying intimacy. Objective Each partner identifies her/his own role in the conflicts. Target Date: 2023-08-05 Frequency: Biweekly  Progress: 30-35 (pt's perspective for herself, says Mary Rocha doesn't articulate but admits he knows) Modality: individual  Related Interventions Assign the couple to set aside daily 15 minutes that are distraction free during which they can communicate about nonconflictual issues; practice during the therapy session, assisting each partner in clarifying communication and expression of feelings. Seek a commitment from each partner to begin to work on changing specific behaviors on her/his list and on the list of the partner for her/him. Objective Increase the frequency and quality of communication with the partner. Target Date: 2023-08-05 Frequency: Biweekly  Progress: 40 Modality: individual  Related Interventions Validate the client's disappointment and disillusionment with the current relationship; assist the client with expressing her expectations and hopes regarding the relationship. Objective Express thoughts and feelings regarding the relationship in a direct manner. Target Date: 2023-08-05 Frequency: Biweekly  Progress: 55 Modality: individual   Related Interventions Use behavioral techniques (e.g., education, modeling, role-playing, corrective feedback, positive reinforcement) to teach the couple problem-solving and conflict-resolution skills, including defining the problem constructively and specifically, brainstorming options, compromise, choosing options, implementing a plan, and evaluating the results. Objective Both partners agree to a "time out" signal that either partner may give to stop interaction that may escalate into abuse. Target Date: 2023-08-05 Frequency: Biweekly  Progress: 60 implementing, following through 40 Modality: individual  Objective Increase time spent in enjoyable contact with the partner. Target Date: 2023-08-05 Frequency: Biweekly  Progress: 45 Modality: individual  6. Eliminate depressive and anxiety symptoms associated with trying to balance multiple roles. Objective Describe role and responsibilities associated with work and family and related thoughts, feelings, and behaviors. Target Date: 2023-08-05 Frequency: Biweekly  Progress: 30 Modality: individual  Related Interventions Explore with the client her multiple roles and responsibilities associated with work and family; clarify related thoughts and feelings. Objective Clarify values and priorities. Target Date: 2023-08-05 Frequency: Biweekly  Progress: 65 Modality: individual  Related Interventions Assign the client to read about progressive muscle relaxation and other calming strategies in relevant books or treatment manuals (e.g., Progressive Relaxation Training by Leroy Kennedy; Mastery of Your Anxiety and Conservator, museum/gallery by Birdie Hopes). Refer the client to initiate conjoint therapy with partner. Objective Implement assertiveness skills and limit setting. Target Date: 2023-08-05 Frequency: Biweekly  Progress: 55-60 Modality: individual  Related Interventions Use behavioral techniques (i.e., education,  modeling, role-playing, corrective feedback, positive reinforcement) to teach communication skills, including assertive communication, offering positive feedback, active listening, making positive requests of others for behavior change, and giving negative feedback in an honest and respectful manner. Objective Learn and implement stress management and relaxation techniques to reduce fatigue, anxiety, and depressive symptoms. Target Date: 2023-08-05 Frequency: Biweekly  Progress: 70 Modality: individual  Related Interventions Explore, along with the client, strategies for making her life more manageable and less stressful (e.g., waking up before the children to exercise or have a cup of tea, pack the children's lunches and backpacks the night before). Objective Communicate needs with partner regarding multiple role obligations. Target Date: 2023-08-05 Frequency: Biweekly  Progress: 30 Modality: individual  Related Interventions Help the client to develop a realistic schedule  that outlines the responsibilities of her partner and family members; help the client enlist the commitment of all individuals to the schedule. Objective Learn and implement problem-solving and conflict-resolution skills. Target Date: 2023-08-05 Frequency: Biweekly  Progress: 50 Modality: individual  Related Interventions Assign the client to conduct family meetings regularly (e.g., possibly biweekly or monthly) with children (ages 57 and up) and partner to delegate household responsibilities. Objective Commit to a healthy eating, sleeping, and exercise routine. Target Date: 2023-08-05 Frequency: Biweekly  Progress: 55 Modality: individual  Objective Generate a list of self-care activities and make a commitment to regularly participate in such activities. Target Date: 2023-08-05 Frequency: Biweekly  Progress: 75 Modality: individual  7. Enhance ability to handle effectively life stressors. 8. Increase awareness of own  role in relationship conflicts. 9. Increase overall sense of well-being via reduction/elimination of anxiety. 10. Maintain a balance between the multiple demands of motherhood, work, and other life roles and responsibilities. 11. Resolve issues involving low self-esteem or low self-efficacy that contribute to anxiety. Objective Identify precipitating events, thoughts, feelings, and reactions that are believed to contribute to anxiety. Target Date: 2023-08-05 Frequency: Biweekly  Progress: 50 Modality: individual  Related Interventions Educate the client on the relaxing, calming, and balancing benefits of practices such as yoga, meditation, and prayer; provide suggestions on how to get started (e.g., book, video, local gym class). Objective Implement self-care strategies that serve to augment the positive effects of other interventions. Target Date: 2023-08-05 Frequency: Biweekly  Progress: 75 Modality: individual  Related Interventions Elicit the client's specific patterns of thought that contribute to anxious states; aid client in understanding the connection between the identified maladaptive thoughts and anxious feelings. Objective Learn and practice methods of reducing anxiety in a variety of situations. Target Date: 2023-08-05 Frequency: Biweekly  Progress: 75 Modality: individual  Related Interventions Educate the client on thought-stopping techniques and positive reframing in order to preempt anxious reactions. Encourage the use of positive self-talk as a replacement to some of the automatic, negative self-talk being utilized currently. Objective Acknowledge underlying irrational or illogical thought patterns that contribute to anxiety. Target Date: 2023-08-05 Frequency: Biweekly  Progress: 50 Modality: individual  Related Interventions Aid the client in seeing the connection between a focus on helping others or improving society and a reduction in anxiety (i.e., dwelling on  maladaptive thoughts). Objective Begin to refer to an internal standard of performance, morals, achievement, appearance, and so on, rather than sociocultural, parental, spousal, or otherwise externally imposed judgments. Target Date: 2023-08-05 Frequency: Biweekly  Progress: 55 Modality: individual   Diagnosis:Adjustment disorder with mixed anxiety and depressed mood  Plan:  -secure marital therapy appointment -continue reading Bound[employer] -read and completed Values Self-Exploration -meet again on Wednesday, October 30, 2022 at [facility].

## 2022-10-17 ENCOUNTER — Ambulatory Visit: Payer: 59 | Admitting: Professional

## 2022-10-30 ENCOUNTER — Ambulatory Visit: Payer: 59 | Admitting: Professional

## 2022-10-31 ENCOUNTER — Ambulatory Visit: Payer: 59 | Admitting: Professional

## 2022-11-14 ENCOUNTER — Encounter: Payer: Self-pay | Admitting: Professional

## 2022-11-14 ENCOUNTER — Ambulatory Visit: Payer: 59 | Admitting: Professional

## 2022-11-14 DIAGNOSIS — F4323 Adjustment disorder with mixed anxiety and depressed mood: Secondary | ICD-10-CM
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
## 2022-11-19 ENCOUNTER — Telehealth: Payer: Self-pay

## 2022-11-19 ENCOUNTER — Ambulatory Visit (INDEPENDENT_AMBULATORY_CARE_PROVIDER_SITE_OTHER): Payer: 59 | Admitting: Family Medicine

## 2022-11-19 ENCOUNTER — Encounter: Payer: Self-pay | Admitting: Family Medicine

## 2022-11-19 VITALS — BP 144/94 | HR 74 | Ht 66.0 in | Wt 253.0 lb

## 2022-11-19 DIAGNOSIS — R7303 Prediabetes: Secondary | ICD-10-CM | POA: Diagnosis not present

## 2022-11-19 DIAGNOSIS — Z6841 Body Mass Index (BMI) 40.0 and over, adult: Secondary | ICD-10-CM

## 2022-11-19 LAB — POCT GLYCOSYLATED HEMOGLOBIN (HGB A1C): Hemoglobin A1C: 5.8 % — AB (ref 4.0–5.6)

## 2022-11-19 MED ORDER — WEGOVY 0.25 MG/0.5ML ~~LOC~~ SOAJ: 0.2500 mg | SUBCUTANEOUS | 0 refills | Status: DC

## 2022-11-19 NOTE — Telephone Encounter (Signed)
Initiated Prior authorization [id number] 0.25MG /0.5ML auto-injectors Via: Covermymeds Case/Key: Status: approved as of 11/19/22 Reason: Authorization Expiration Date: March 21, 2023 Notified Pt via: Mychart

## 2022-11-19 NOTE — Progress Notes (Signed)
Established patient visit   Patient: Mary Rocha   DOB: August 07, 1976   47 y.o. Female  MRN: [id number] Visit Date: 11/19/2022  Today's healthcare provider: Owens Loffler, DO   Chief Complaint  Patient presents with   Follow-up    SUBJECTIVE    Chief Complaint  Patient presents with   Follow-up   HPI  Prediabetes Pt here today to follow up. Last A1C was 6.4. She is on metformin 1000mg  BID. She has improved her diet and now exercises 4 days a week. She has lost 12lbs during the last six months on her weight loss journey. She is here today to discuss weight loss medication.   She was previously on phentermine and has not had any weight loss and ended up gaining more weight after coming off it.   Review of Systems  Constitutional:  Negative for activity change, fatigue and fever.  Respiratory:  Negative for cough and shortness of breath.   Cardiovascular:  Negative for chest pain.  Gastrointestinal:  Negative for abdominal pain.  Genitourinary:  Negative for difficulty urinating.       Current Meds  Medication Sig   metFORMIN (GLUCOPHAGE) 1000 MG tablet Take 1 tablet (1,000 mg total) by mouth 2 (two) times daily with a meal.   simvastatin (ZOCOR) 20 MG tablet Take 20 mg by mouth daily at 6 PM.   WEGOVY 0.25 MG/0.5ML SOAJ Inject 0.25 mg into the skin once a week. Use this dose for 1 month (4 shots) and then increase to next higher dose.    OBJECTIVE    BP (!) 144/94   Pulse 74   Ht 5\' 6"  (1.676 m)   Wt 253 lb (114.8 kg)   SpO2 99%   BMI 40.84 kg/m   Physical Exam Vitals and nursing note reviewed.  Constitutional:      General: She is not in acute distress.    Appearance: Normal appearance.  HENT:     Head: Normocephalic and atraumatic.     Right Ear: External ear normal.     Left Ear: External ear normal.     Nose: Nose normal.  Eyes:     Conjunctiva/sclera: Conjunctivae normal.  Cardiovascular:     Rate and Rhythm: Normal rate and regular  rhythm.  Pulmonary:     Effort: Pulmonary effort is normal.     Breath sounds: Normal breath sounds.  Neurological:     General: No focal deficit present.     Mental Status: She is alert and oriented to person, place, and time.  Psychiatric:        Mood and Affect: Mood normal.        Behavior: Behavior normal.        Thought Content: Thought content normal.        Judgment: Judgment normal.        ASSESSMENT & PLAN    Problem List Items Addressed This Visit       Other   Prediabetes - Primary    -A1c improved to 5.8 on poc, continue diet and exercise modification      Relevant Orders   POCT UA - Microalbumin   POCT HgB A1C (Completed)   Class 3 severe obesity due to excess calories with serious comorbidity and body mass index (BMI) of 40.0 to 44.9 in adult [facility])    Pt has done diet and exercise and is doing well with this, but we need to increase weight loss. She has lost  12lbs by lifestyle changes but bmi is still elevated to 40.84. have ordered wegovy for patient and she will continue lifestyle changes.  - follow up in one month after wegovy has been started so we can increase dose per titration protocol      Relevant Medications   WEGOVY 0.25 MG/0.5ML SOAJ    Return in about 3 weeks (around 12/10/2022).      Meds ordered this encounter  Medications   WEGOVY 0.25 MG/0.5ML SOAJ    Sig: Inject 0.25 mg into the skin once a week. Use this dose for 1 month (4 shots) and then increase to next higher dose.    Dispense:  2 mL    Refill:  0    Orders Placed This Encounter  Procedures   POCT UA - Microalbumin   POCT HgB A1C     Owens Loffler, DO  [place] at [facility] [phone] (phone) [phone] (fax)  [place]

## 2022-11-19 NOTE — Assessment & Plan Note (Signed)
-  A1c improved to 5.8 on poc, continue diet and exercise modification

## 2022-11-19 NOTE — Assessment & Plan Note (Signed)
Pt has done diet and exercise and is doing well with this, but we need to increase weight loss. She has lost 12lbs by lifestyle changes but bmi is still elevated to 40.84. have ordered wegovy for patient and she will continue lifestyle changes.  - follow up in one month after wegovy has been started so we can increase dose per titration protocol

## 2022-11-28 ENCOUNTER — Ambulatory Visit: Payer: 59 | Admitting: Professional

## 2022-12-10 ENCOUNTER — Ambulatory Visit: Payer: 59 | Admitting: Family Medicine

## 2022-12-10 ENCOUNTER — Encounter: Payer: Self-pay | Admitting: Family Medicine

## 2022-12-10 MED ORDER — SEMAGLUTIDE-WEIGHT MANAGEMENT 2.4 MG/0.75ML ~~LOC~~ SOAJ: 2.4000 mg | SUBCUTANEOUS | 3 refills | Status: DC

## 2022-12-10 MED ORDER — SEMAGLUTIDE-WEIGHT MANAGEMENT 1 MG/0.5ML ~~LOC~~ SOAJ: 1.0000 mg | SUBCUTANEOUS | 0 refills | Status: AC

## 2022-12-10 MED ORDER — SEMAGLUTIDE-WEIGHT MANAGEMENT 1.7 MG/0.75ML ~~LOC~~ SOAJ: 1.7000 mg | SUBCUTANEOUS | 0 refills | Status: AC

## 2022-12-10 NOTE — Assessment & Plan Note (Signed)
-   pt tolerating wegovy well  - will go ahead and order titration doses  - follow up in 3 months

## 2022-12-10 NOTE — Progress Notes (Signed)
Established patient visit   Patient: Mary Rocha   DOB: 02/23/1976   47 y.o. Female  MRN: [id number] Visit Date: 12/10/2022  Today's healthcare provider: Charlton Amor, DO   Chief Complaint  Patient presents with   Hyperglycemia    States she had mild nausea from [facility], but no other concerns.    SUBJECTIVE    Chief Complaint  Patient presents with   Hyperglycemia    States she had mild nausea from [facility], but no other concerns.   HPI  Pt presents for follow up on wegovy. She was started on the 0.25mg  dose and has not had any side effects. She is doing well with this. She had some nausea in the beginning but it has subsided.  Review of Systems  Constitutional:  Negative for activity change, fatigue and fever.  Respiratory:  Negative for cough and shortness of breath.   Cardiovascular:  Negative for chest pain.  Gastrointestinal:  Negative for abdominal pain.  Genitourinary:  Negative for difficulty urinating.      Current Meds  Medication Sig   metFORMIN (GLUCOPHAGE) 1000 MG tablet Take 1 tablet (1,000 mg total) by mouth 2 (two) times daily with a meal.   Semaglutide-Weight Management (WEGOVY) 0.25 MG/0.5ML SOAJ Inject 0.25 mg into the skin.   [START ON 02/06/2023] Semaglutide-Weight Management 1 MG/0.5ML SOAJ Inject 1 mg into the skin once a week for 28 days.   [START ON 03/07/2023] Semaglutide-Weight Management 1.7 MG/0.75ML SOAJ Inject 1.7 mg into the skin once a week for 28 days.   [START ON 04/05/2023] Semaglutide-Weight Management 2.4 MG/0.75ML SOAJ Inject 2.4 mg into the skin once a week.    OBJECTIVE    BP (!) 113/46   Pulse 85   Temp 98.6 F (37 C) (Oral)   Ht  (1.676 m)   Wt 254 lb 1.3 oz (115.2 kg)   SpO2 97%   BMI 41.01 kg/m   Physical Exam Vitals and nursing note reviewed.  Constitutional:      General: She is not in acute distress.    Appearance: Normal appearance.  HENT:     Head: Normocephalic and atraumatic.     Right Ear:  External ear normal.     Left Ear: External ear normal.     Nose: Nose normal.  Eyes:     Conjunctiva/sclera: Conjunctivae normal.  Cardiovascular:     Rate and Rhythm: Normal rate and regular rhythm.  Pulmonary:     Effort: Pulmonary effort is normal.     Breath sounds: Normal breath sounds.  Neurological:     General: No focal deficit present.     Mental Status: She is alert and oriented to person, place, and time.  Psychiatric:        Mood and Affect: Mood normal.        Behavior: Behavior normal.        Thought Content: Thought content normal.        Judgment: Judgment normal.       ASSESSMENT & PLAN    Problem List Items Addressed This Visit       Other   Class 3 severe obesity due to excess calories with serious comorbidity and body mass index (BMI) of 40.0 to 44.9 in adult - Primary    - pt tolerating wegovy well  - will go ahead and order titration doses  - follow up in 3 months      Relevant Medications   Semaglutide-Weight  Management (WEGOVY) 0.25 MG/0.5ML SOAJ   Semaglutide-Weight Management 1 MG/0.5ML SOAJ (Start on 02/06/2023)   Semaglutide-Weight Management 1.7 MG/0.75ML SOAJ (Start on 03/07/2023)   Semaglutide-Weight Management 2.4 MG/0.75ML SOAJ (Start on 04/05/2023)    Return in about 3 months (around 03/11/2023).      Meds ordered this encounter  Medications   Semaglutide-Weight Management 1 MG/0.5ML SOAJ    Sig: Inject 1 mg into the skin once a week for 28 days.    Dispense:  2 mL    Refill:  0   Semaglutide-Weight Management 1.7 MG/0.75ML SOAJ    Sig: Inject 1.7 mg into the skin once a week for 28 days.    Dispense:  3 mL    Refill:  0   Semaglutide-Weight Management 2.4 MG/0.75ML SOAJ    Sig: Inject 2.4 mg into the skin once a week.    Dispense:  3 mL    Refill:  3    No orders of the defined types were placed in this encounter.    Charlton Amor, DO  [facility] at [facility] [phone]  (phone) [phone] (fax)  [facility]

## 2022-12-10 NOTE — Patient Instructions (Addendum)
Medication titration 0.5mg --for four weeks --for four weeks 1.7mg --for four weeks 2.4mg  thereafter

## 2022-12-12 ENCOUNTER — Encounter: Payer: Self-pay | Admitting: Professional

## 2022-12-12 ENCOUNTER — Ambulatory Visit: Payer: 59 | Admitting: Professional

## 2022-12-12 DIAGNOSIS — F4323 Adjustment disorder with mixed anxiety and depressed mood: Secondary | ICD-10-CM | POA: Diagnosis not present

## 2022-12-12 NOTE — Progress Notes (Signed)
Behavioral Health Counselor/Therapist Progress Note  Patient ID: Mary Rocha, MRN: [id number],    Date: 12/12/2022  Time Spent: 56 minutes 1-156pm  Treatment Type: Individual Therapy  Risk Assessment: Danger to Self:  No Self-injurious Behavior: No Danger to Others: No  Subjective: The patient arrived on time for her in-person appointment.   Issues: 1-marital a-she and spouse are doing better b-have marital session scheduled for next week c-miscommunication still the major barrier d-husband is very defensive when she does not agree with him 2-brother Trinna Post a-want her to join a gym -becomes easily obsessed with new things -he has been sober for nearly two years and wants everyone to be as healthy as possible b-he calls pt as many as 20 times per day, with her opting to answer only 2-3 times in a day c-how to set boundaries around call volume 3-mother a-wishes her mother wanted to spend time with her b-feels disappointed that her mother will travel with other side of family but not with her c-mother has late canceled two times when agreeing to go away with her and her sister for a weekend d-her mother is upset that pt's MIL is closer to pt's daughter [name] than she -pt explained that it is an issue of proximity since mother lives 8 hours away  2023 Treatment Plan (reviewed 10/01/2022) Problems Addressed  Anxiety, Balancing Work and Family/Multiple Roles, Depression, Partner Relational Problems  Goals 1. Address and eliminate maladaptive thought processes that lead to anxious responses. 2. Develop a realistic perspective regarding demands and obligations of multiple roles and their completion. 3. Develop a sense of own personal power and self-esteem within the relationship. 4. Develop healthy cognitive mechanisms to facilitate positive attitudes and beliefs about self within the context of one's environment to mitigate depressive symptoms. Objective Articulate  self-care methods to prevent and/or manage depression in the future; create a list of 10 coping methods for future use. Target Date: 2023-08-05 Frequency: Biweekly  Progress: 70 (gf time, coffee dates, MIL pick up [name], carpool, exercise, time w [name]) Modality: individual  Objective Increase the level of physical exercise. Target Date: 2023-08-05 Frequency: Biweekly  Progress: 80 Modality: individual  Objective Implement assertiveness to communicate needs and desires to others. Target Date: 2023-08-05 Frequency: Biweekly  Progress: 40 (certain people, primarily Thayer Ohm, she doesn't know how to deal with frustration with having the conversation) Modality: individual  Related Interventions Offer the client booster sessions as necessary. Objective Increase the frequency of engaging in pleasant activities. Target Date: 2023-08-05 Frequency: Biweekly  Progress: 75 Modality: individual  Related Interventions Empower the client to connect with a positive female role model through bibliotherapy and/or with family members, friends, or Insurance risk surveyor; review how the client may apply the adaptive behaviors of the role model to her own life. Objective Identify and replace cognitive self-talk that supports depression. Target Date: 2023-08-05 Frequency: Biweekly  Progress: 55 Modality: individual  Related Interventions Do "behavioral experiments" in which depressive automatic thoughts are treated as hypotheses/predictions, reality-based alternative hypotheses/ predictions are generated, and both are tested against the client's past, present, and/or future experiences. Encourage the client to discuss cognitive distortions, including automatic thoughts (e.g., negative view of self, future, experience) and negative schemas (e.g., core beliefs about self and others based on earlier childhood experiences); assess frequency of negative self-statements associated with depression. Reinforce the client's  positive, reality-based cognitive messages that enhance self-confidence and increase adaptive action (see "Positive Self-Talk" in the Adult Psychotherapy Homework Planner, 2nd ed. by Stephannie Li). Objective Articulate  signs and symptoms of depression in current life experiences. Target Date: 2023-08-05 Frequency: Biweekly  Progress: 35 Modality: individual  Related Interventions Encourage the client to share her feelings of depression to gain an insight into precipitating events and implications of symptoms; normalize her feelings of depression. Encourage the client to describe current and childhood experiences associated with key relationships (e.g., family-of-origin, peer and school relationships, dating relationships, female role models, extended family relationships), roles (e.g., partner, caretaker, worker, Consulting civil engineer), and cultural experiences (e.g., gender roles) that may contribute to depression. 5. Develop the necessary skills for effective, open communication and mutually satisfying intimacy. Objective Each partner identifies her/his own role in the conflicts. Target Date: 2023-08-05 Frequency: Biweekly  Progress: 30-35 (pt's perspective for herself, says Thayer Ohm doesn't articulate but admits he knows) Modality: individual  Related Interventions Assign the couple to set aside daily 15 minutes that are distraction free during which they can communicate about nonconflictual issues; practice during the therapy session, assisting each partner in clarifying communication and expression of feelings. Seek a commitment from each partner to begin to work on changing specific behaviors on her/his list and on the list of the partner for her/him. Objective Increase the frequency and quality of communication with the partner. Target Date: 2023-08-05 Frequency: Biweekly  Progress: 40 Modality: individual  Related Interventions Validate the client's disappointment and disillusionment with the current  relationship; assist the client with expressing her expectations and hopes regarding the relationship. Objective Express thoughts and feelings regarding the relationship in a direct manner. Target Date: 2023-08-05 Frequency: Biweekly  Progress: 55 Modality: individual  Related Interventions Use behavioral techniques (e.g., education, modeling, role-playing, corrective feedback, positive reinforcement) to teach the couple problem-solving and conflict-resolution skills, including defining the problem constructively and specifically, brainstorming options, compromise, choosing options, implementing a plan, and evaluating the results. Objective Both partners agree to a "time out" signal that either partner may give to stop interaction that may escalate into abuse. Target Date: 2023-08-05 Frequency: Biweekly  Progress: 60 implementing, following through 40 Modality: individual  Objective Increase time spent in enjoyable contact with the partner. Target Date: 2023-08-05 Frequency: Biweekly  Progress: 45 Modality: individual  6. Eliminate depressive and anxiety symptoms associated with trying to balance multiple roles. Objective Describe role and responsibilities associated with work and family and related thoughts, feelings, and behaviors. Target Date: 2023-08-05 Frequency: Biweekly  Progress: 30 Modality: individual  Related Interventions Explore with the client her multiple roles and responsibilities associated with work and family; clarify related thoughts and feelings. Objective Clarify values and priorities. Target Date: 2023-08-05 Frequency: Biweekly  Progress: 65 Modality: individual  Related Interventions Assign the client to read about progressive muscle relaxation and other calming strategies in relevant books or treatment manuals (e.g., Progressive Relaxation Training by Twana First; Mastery of Your Anxiety and Optometrist by Lucita Ferrara). Refer the client to initiate conjoint therapy with partner. Objective Implement assertiveness skills and limit setting. Target Date: 2023-08-05 Frequency: Biweekly  Progress: 55-60 Modality: individual  Related Interventions Use behavioral techniques (i.e., education, modeling, role-playing, corrective feedback, positive reinforcement) to teach communication skills, including assertive communication, offering positive feedback, active listening, making positive requests of others for behavior change, and giving negative feedback in an honest and respectful manner. Objective Learn and implement stress management and relaxation techniques to reduce fatigue, anxiety, and depressive symptoms. Target Date: 2023-08-05 Frequency: Biweekly  Progress: 70 Modality: individual  Related Interventions Explore, along with the client, strategies for making her life more manageable  and less stressful (e.g., waking up before the children to exercise or have a cup of tea, pack the children's lunches and backpacks the night before). Objective Communicate needs with partner regarding multiple role obligations. Target Date: 2023-08-05 Frequency: Biweekly  Progress: 30 Modality: individual  Related Interventions Help the client to develop a realistic schedule that outlines the responsibilities of her partner and family members; help the client enlist the commitment of all individuals to the schedule. Objective Learn and implement problem-solving and conflict-resolution skills. Target Date: 2023-08-05 Frequency: Biweekly  Progress: 50 Modality: individual  Related Interventions Assign the client to conduct family meetings regularly (e.g., possibly biweekly or monthly) with children (ages 77 and up) and partner to delegate household responsibilities. Objective Commit to a healthy eating, sleeping, and exercise routine. Target Date: 2023-08-05 Frequency: Biweekly  Progress: 55 Modality: individual   Objective Generate a list of self-care activities and make a commitment to regularly participate in such activities. Target Date: 2023-08-05 Frequency: Biweekly  Progress: 75 Modality: individual  7. Enhance ability to handle effectively life stressors. 8. Increase awareness of own role in relationship conflicts. 9. Increase overall sense of well-being via reduction/elimination of anxiety. 10. Maintain a balance between the multiple demands of motherhood, work, and other life roles and responsibilities. 11. Resolve issues involving low self-esteem or low self-efficacy that contribute to anxiety. Objective Identify precipitating events, thoughts, feelings, and reactions that are believed to contribute to anxiety. Target Date: 2023-08-05 Frequency: Biweekly  Progress: 50 Modality: individual  Related Interventions Educate the client on the relaxing, calming, and balancing benefits of practices such as yoga, meditation, and prayer; provide suggestions on how to get started (e.g., book, video, local gym class). Objective Implement self-care strategies that serve to augment the positive effects of other interventions. Target Date: 2023-08-05 Frequency: Biweekly  Progress: 75 Modality: individual  Related Interventions Elicit the client's specific patterns of thought that contribute to anxious states; aid client in understanding the connection between the identified maladaptive thoughts and anxious feelings. Objective Learn and practice methods of reducing anxiety in a variety of situations. Target Date: 2023-08-05 Frequency: Biweekly  Progress: 75 Modality: individual  Related Interventions Educate the client on thought-stopping techniques and positive reframing in order to preempt anxious reactions. Encourage the use of positive self-talk as a replacement to some of the automatic, negative self-talk being utilized currently. Objective Acknowledge underlying irrational or illogical thought  patterns that contribute to anxiety. Target Date: 2023-08-05 Frequency: Biweekly  Progress: 50 Modality: individual  Related Interventions Aid the client in seeing the connection between a focus on helping others or improving society and a reduction in anxiety (i.e., dwelling on maladaptive thoughts). Objective Begin to refer to an internal standard of performance, morals, achievement, appearance, and so on, rather than sociocultural, parental, spousal, or otherwise externally imposed judgments. Target Date: 2023-08-05 Frequency: Biweekly  Progress: 55 Modality: individual   Diagnosis:Adjustment disorder with mixed anxiety and depressed mood  Plan:  -meet again on Thursday, Dec 26, 2022 at 1pm in-person.

## 2022-12-19 ENCOUNTER — Other Ambulatory Visit: Payer: Self-pay | Admitting: Family Medicine

## 2022-12-19 ENCOUNTER — Encounter: Payer: Self-pay | Admitting: Family Medicine

## 2022-12-19 MED ORDER — WEGOVY 0.5 MG/0.5ML ~~LOC~~ SOAJ: 0.5000 mg | SUBCUTANEOUS | 0 refills | Status: DC

## 2022-12-26 ENCOUNTER — Encounter: Payer: Self-pay | Admitting: Professional

## 2022-12-26 ENCOUNTER — Ambulatory Visit (INDEPENDENT_AMBULATORY_CARE_PROVIDER_SITE_OTHER): Payer: 59 | Admitting: Professional

## 2022-12-26 DIAGNOSIS — F4323 Adjustment disorder with mixed anxiety and depressed mood: Secondary | ICD-10-CM

## 2022-12-26 NOTE — Progress Notes (Signed)
[facility] Counselor/Therapist Progress Note  Patient ID: Mary Rocha, MRN: [id number],    Date: 12/26/2022  Time Spent: 53 minutes [id number]  Treatment Type: Individual Therapy  Risk Assessment: Danger to Self:  No Self-injurious Behavior: No Danger to Others: No  Subjective: The patient arrived late for her in-person appointment.   Issues: 1-marital a-she and spouse are doing better b-have marital session scheduled for next week c-miscommunication still the major barrier d-husband is very defensive when she does not agree with him 2-brother Mary Rocha a-want her to join a gym -becomes easily obsessed with new things -he has been sober for nearly two years and wants everyone to be as healthy as possible b-he calls pt as many as 20 times per day, with her opting to answer only 2-3 times in a day c-how to set boundaries around call volume 3-mother a-wishes her mother wanted to spend time with her b-feels disappointed that her mother will travel with other side of family but not with her c-mother has late canceled two times when agreeing to go away with her and her sister for a weekend d-her mother is upset that pt's MIL is closer to pt's daughter Mary Rocha than she -pt explained that it is an issue of proximity since mother lives 8 hours away  2023 Treatment Plan (reviewed 10/01/2022) Problems Addressed  Anxiety, Balancing Work and Family/Multiple Roles, Depression, Partner Relational Problems  Goals 1. Address and eliminate maladaptive thought processes that lead to anxious responses. 2. Develop a realistic perspective regarding demands and obligations of multiple roles and their completion. 3. Develop a sense of own personal power and self-esteem within the relationship. 4. Develop healthy cognitive mechanisms to facilitate positive attitudes and beliefs about self within the context of one's environment to mitigate depressive symptoms. Objective Articulate  self-care methods to prevent and/or manage depression in the future; create a list of 10 coping methods for future use. Target Date: 2023-08-05 Frequency: Biweekly  Progress: 70 (gf time, coffee dates, MIL pick up Mary Rocha, carpool, exercise, time w Mary Rocha) Modality: individual  Objective Increase the level of physical exercise. Target Date: 2023-08-05 Frequency: Biweekly  Progress: 80 Modality: individual  Objective Implement assertiveness to communicate needs and desires to others. Target Date: 2023-08-05 Frequency: Biweekly  Progress: 40 (certain people, primarily Thayer Ohm, she doesn't know how to deal with frustration with having the conversation) Modality: individual  Related Interventions Offer the client booster sessions as necessary. Objective Increase the frequency of engaging in pleasant activities. Target Date: 2023-08-05 Frequency: Biweekly  Progress: 75 Modality: individual  Related Interventions Empower the client to connect with a positive female role model through bibliotherapy and/or with family members, friends, or Insurance risk surveyor; review how the client may apply the adaptive behaviors of the role model to her own life. Objective Identify and replace cognitive self-talk that supports depression. Target Date: 2023-08-05 Frequency: Biweekly  Progress: 55 Modality: individual  Related Interventions Do "behavioral experiments" in which depressive automatic thoughts are treated as hypotheses/predictions, reality-based alternative hypotheses/ predictions are generated, and both are tested against the client's past, present, and/or future experiences. Encourage the client to discuss cognitive distortions, including automatic thoughts (e.g., negative view of self, future, experience) and negative schemas (e.g., core beliefs about self and others based on earlier childhood experiences); assess frequency of negative self-statements associated with depression. Reinforce the client's  positive, reality-based cognitive messages that enhance self-confidence and increase adaptive action (see "Positive Self-Talk" in the Adult Psychotherapy Homework Planner, 2nd ed. by Stephannie Li). Objective Articulate signs  and symptoms of depression in current life experiences. Target Date: 2023-08-05 Frequency: Biweekly  Progress: 35 Modality: individual  Related Interventions Encourage the client to share her feelings of depression to gain an insight into precipitating events and implications of symptoms; normalize her feelings of depression. Encourage the client to describe current and childhood experiences associated with key relationships (e.g., family-of-origin, peer and school relationships, dating relationships, female role models, extended family relationships), roles (e.g., partner, caretaker, worker, Consulting civil engineer), and cultural experiences (e.g., gender roles) that may contribute to depression. 5. Develop the necessary skills for effective, open communication and mutually satisfying intimacy. Objective Each partner identifies her/his own role in the conflicts. Target Date: 2023-08-05 Frequency: Biweekly  Progress: 30-35 (pt's perspective for herself, says Thayer Ohm doesn't articulate but admits he knows) Modality: individual  Related Interventions Assign the couple to set aside daily 15 minutes that are distraction free during which they can communicate about nonconflictual issues; practice during the therapy session, assisting each partner in clarifying communication and expression of feelings. Seek a commitment from each partner to begin to work on changing specific behaviors on her/his list and on the list of the partner for her/him. Objective Increase the frequency and quality of communication with the partner. Target Date: 2023-08-05 Frequency: Biweekly  Progress: 40 Modality: individual  Related Interventions Validate the client's disappointment and disillusionment with the current  relationship; assist the client with expressing her expectations and hopes regarding the relationship. Objective Express thoughts and feelings regarding the relationship in a direct manner. Target Date: 2023-08-05 Frequency: Biweekly  Progress: 55 Modality: individual  Related Interventions Use behavioral techniques (e.g., education, modeling, role-playing, corrective feedback, positive reinforcement) to teach the couple problem-solving and conflict-resolution skills, including defining the problem constructively and specifically, brainstorming options, compromise, choosing options, implementing a plan, and evaluating the results. Objective Both partners agree to a "time out" signal that either partner may give to stop interaction that may escalate into abuse. Target Date: 2023-08-05 Frequency: Biweekly  Progress: 60 implementing, following through 40 Modality: individual  Objective Increase time spent in enjoyable contact with the partner. Target Date: 2023-08-05 Frequency: Biweekly  Progress: 45 Modality: individual  6. Eliminate depressive and anxiety symptoms associated with trying to balance multiple roles. Objective Describe role and responsibilities associated with work and family and related thoughts, feelings, and behaviors. Target Date: 2023-08-05 Frequency: Biweekly  Progress: 30 Modality: individual  Related Interventions Explore with the client her multiple roles and responsibilities associated with work and family; clarify related thoughts and feelings. Objective Clarify values and priorities. Target Date: 2023-08-05 Frequency: Biweekly  Progress: 65 Modality: individual  Related Interventions Assign the client to read about progressive muscle relaxation and other calming strategies in relevant books or treatment manuals (e.g., Progressive Relaxation Training by Twana First; Mastery of Your Anxiety and Optometrist by Lucita Ferrara). Refer the client to initiate conjoint therapy with partner. Objective Implement assertiveness skills and limit setting. Target Date: 2023-08-05 Frequency: Biweekly  Progress: 55-60 Modality: individual  Related Interventions Use behavioral techniques (i.e., education, modeling, role-playing, corrective feedback, positive reinforcement) to teach communication skills, including assertive communication, offering positive feedback, active listening, making positive requests of others for behavior change, and giving negative feedback in an honest and respectful manner. Objective Learn and implement stress management and relaxation techniques to reduce fatigue, anxiety, and depressive symptoms. Target Date: 2023-08-05 Frequency: Biweekly  Progress: 70 Modality: individual  Related Interventions Explore, along with the client, strategies for making her life more manageable and  less stressful (e.g., waking up before the children to exercise or have a cup of tea, pack the children's lunches and backpacks the night before). Objective Communicate needs with partner regarding multiple role obligations. Target Date: 2023-08-05 Frequency: Biweekly  Progress: 30 Modality: individual  Related Interventions Help the client to develop a realistic schedule that outlines the responsibilities of her partner and family members; help the client enlist the commitment of all individuals to the schedule. Objective Learn and implement problem-solving and conflict-resolution skills. Target Date: 2023-08-05 Frequency: Biweekly  Progress: 50 Modality: individual  Related Interventions Assign the client to conduct family meetings regularly (e.g., possibly biweekly or monthly) with children (ages 73 and up) and partner to delegate household responsibilities. Objective Commit to a healthy eating, sleeping, and exercise routine. Target Date: 2023-08-05 Frequency: Biweekly  Progress: 55 Modality: individual   Objective Generate a list of self-care activities and make a commitment to regularly participate in such activities. Target Date: 2023-08-05 Frequency: Biweekly  Progress: 75 Modality: individual  7. Enhance ability to handle effectively life stressors. 8. Increase awareness of own role in relationship conflicts. 9. Increase overall sense of well-being via reduction/elimination of anxiety. 10. Maintain a balance between the multiple demands of motherhood, work, and other life roles and responsibilities. 11. Resolve issues involving low self-esteem or low self-efficacy that contribute to anxiety. Objective Identify precipitating events, thoughts, feelings, and reactions that are believed to contribute to anxiety. Target Date: 2023-08-05 Frequency: Biweekly  Progress: 50 Modality: individual  Related Interventions Educate the client on the relaxing, calming, and balancing benefits of practices such as yoga, meditation, and prayer; provide suggestions on how to get started (e.g., book, video, local gym class). Objective Implement self-care strategies that serve to augment the positive effects of other interventions. Target Date: 2023-08-05 Frequency: Biweekly  Progress: 75 Modality: individual  Related Interventions Elicit the client's specific patterns of thought that contribute to anxious states; aid client in understanding the connection between the identified maladaptive thoughts and anxious feelings. Objective Learn and practice methods of reducing anxiety in a variety of situations. Target Date: 2023-08-05 Frequency: Biweekly  Progress: 75 Modality: individual  Related Interventions Educate the client on thought-stopping techniques and positive reframing in order to preempt anxious reactions. Encourage the use of positive self-talk as a replacement to some of the automatic, negative self-talk being utilized currently. Objective Acknowledge underlying irrational or illogical thought  patterns that contribute to anxiety. Target Date: 2023-08-05 Frequency: Biweekly  Progress: 50 Modality: individual  Related Interventions Aid the client in seeing the connection between a focus on helping others or improving society and a reduction in anxiety (i.e., dwelling on maladaptive thoughts). Objective Begin to refer to an internal standard of performance, morals, achievement, appearance, and so on, rather than sociocultural, parental, spousal, or otherwise externally imposed judgments. Target Date: 2023-08-05 Frequency: Biweekly  Progress: 55 Modality: individual   Diagnosis:Adjustment disorder with mixed anxiety and depressed mood  Plan:  -write out some things to discuss with Thayer Ohm -invite Thayer Ohm to a session -meet again on Monday, Jan 06, 2023 at 11am virtual

## 2023-01-06 ENCOUNTER — Ambulatory Visit: Payer: 59 | Admitting: Professional

## 2023-01-09 ENCOUNTER — Ambulatory Visit: Payer: 59 | Admitting: Professional

## 2023-01-23 ENCOUNTER — Ambulatory Visit: Payer: 59 | Admitting: Professional

## 2023-01-23 ENCOUNTER — Encounter: Payer: Self-pay | Admitting: Professional

## 2023-01-23 DIAGNOSIS — F4323 Adjustment disorder with mixed anxiety and depressed mood: Secondary | ICD-10-CM | POA: Diagnosis not present

## 2023-01-23 NOTE — Progress Notes (Signed)
[facility] Counselor/Therapist Progress Note  Patient ID: Mary Rocha, MRN: [id number],    Date: 01/23/2023  Time Spent: 55 minutes [id number]  Treatment Type: Individual Therapy  Risk Assessment: Danger to Self:  No Self-injurious Behavior: No Danger to Others: No  Subjective: The patient arrived late for her in-person appointment.   Issues: 1-homework- completed a-write out some things to discuss with Thayer Ohm -invite Thayer Ohm to a session 2-marital -have had first session but cannot recall name of therapist -wants opportunity to express her concerns in marital therapy with Thayer Ohm listening and not defending -pt tends to be more blunt and Thayer Ohm tells her she is rude -educated -examples -pt sees herself as assertive and not aggressive -how to soften approach so that Thayer Ohm will hear what she is saying since the blunt approach does not work -pt unwilling to coddle and thinks this is what Thayer Ohm expects -years ago pt was more emotional and that did not work for [employer] so she became more cognitive and that doesn't work either -talked with pt about importance of avoiding all or none thinking  2023 Treatment Plan (reviewed 10/01/2022) Problems Addressed  Anxiety, Balancing Work and Family/Multiple Roles, Depression, Partner Relational Problems  Goals 1. Address and eliminate maladaptive thought processes that lead to anxious responses. 2. Develop a realistic perspective regarding demands and obligations of multiple roles and their completion. 3. Develop a sense of own personal power and self-esteem within the relationship. 4. Develop healthy cognitive mechanisms to facilitate positive attitudes and beliefs about self within the context of one's environment to mitigate depressive symptoms. Objective Articulate self-care methods to prevent and/or manage depression in the future; create a list of 10 coping methods for future use. Target Date: 2023-08-05 Frequency:  Biweekly  Progress: 70 (gf time, coffee dates, MIL pick up [name], carpool, exercise, time w [name]) Modality: individual  Objective Increase the level of physical exercise. Target Date: 2023-08-05 Frequency: Biweekly  Progress: 80 Modality: individual  Objective Implement assertiveness to communicate needs and desires to others. Target Date: 2023-08-05 Frequency: Biweekly  Progress: 40 (certain people, primarily Thayer Ohm, she doesn't know how to deal with frustration with having the conversation) Modality: individual  Related Interventions Offer the client booster sessions as necessary. Objective Increase the frequency of engaging in pleasant activities. Target Date: 2023-08-05 Frequency: Biweekly  Progress: 75 Modality: individual  Related Interventions Empower the client to connect with a positive female role model through bibliotherapy and/or with family members, friends, or Insurance risk surveyor; review how the client may apply the adaptive behaviors of the role model to her own life. Objective Identify and replace cognitive self-talk that supports depression. Target Date: 2023-08-05 Frequency: Biweekly  Progress: 55 Modality: individual  Related Interventions Do "behavioral experiments" in which depressive automatic thoughts are treated as hypotheses/predictions, reality-based alternative hypotheses/ predictions are generated, and both are tested against the client's past, present, and/or future experiences. Encourage the client to discuss cognitive distortions, including automatic thoughts (e.g., negative view of self, future, experience) and negative schemas (e.g., core beliefs about self and others based on earlier childhood experiences); assess frequency of negative self-statements associated with depression. Reinforce the client's positive, reality-based cognitive messages that enhance self-confidence and increase adaptive action (see "Positive Self-Talk" in the Adult Psychotherapy  Homework Planner, 2nd ed. by Stephannie Li). Objective Articulate signs and symptoms of depression in current life experiences. Target Date: 2023-08-05 Frequency: Biweekly  Progress: 35 Modality: individual  Related Interventions Encourage the client to share her feelings of depression to gain an insight into  precipitating events and implications of symptoms; normalize her feelings of depression. Encourage the client to describe current and childhood experiences associated with key relationships (e.g., family-of-origin, peer and school relationships, dating relationships, female role models, extended family relationships), roles (e.g., partner, caretaker, worker, Consulting civil engineer), and cultural experiences (e.g., gender roles) that may contribute to depression. 5. Develop the necessary skills for effective, open communication and mutually satisfying intimacy. Objective Each partner identifies her/his own role in the conflicts. Target Date: 2023-08-05 Frequency: Biweekly  Progress: 30-35 (pt's perspective for herself, says Thayer Ohm doesn't articulate but admits he knows) Modality: individual  Related Interventions Assign the couple to set aside daily 15 minutes that are distraction free during which they can communicate about nonconflictual issues; practice during the therapy session, assisting each partner in clarifying communication and expression of feelings. Seek a commitment from each partner to begin to work on changing specific behaviors on her/his list and on the list of the partner for her/him. Objective Increase the frequency and quality of communication with the partner. Target Date: 2023-08-05 Frequency: Biweekly  Progress: 40 Modality: individual  Related Interventions Validate the client's disappointment and disillusionment with the current relationship; assist the client with expressing her expectations and hopes regarding the relationship. Objective Express thoughts and feelings regarding the  relationship in a direct manner. Target Date: 2023-08-05 Frequency: Biweekly  Progress: 55 Modality: individual  Related Interventions Use behavioral techniques (e.g., education, modeling, role-playing, corrective feedback, positive reinforcement) to teach the couple problem-solving and conflict-resolution skills, including defining the problem constructively and specifically, brainstorming options, compromise, choosing options, implementing a plan, and evaluating the results. Objective Both partners agree to a "time out" signal that either partner may give to stop interaction that may escalate into abuse. Target Date: 2023-08-05 Frequency: Biweekly  Progress: 60 implementing, following through 40 Modality: individual  Objective Increase time spent in enjoyable contact with the partner. Target Date: 2023-08-05 Frequency: Biweekly  Progress: 45 Modality: individual  6. Eliminate depressive and anxiety symptoms associated with trying to balance multiple roles. Objective Describe role and responsibilities associated with work and family and related thoughts, feelings, and behaviors. Target Date: 2023-08-05 Frequency: Biweekly  Progress: 30 Modality: individual  Related Interventions Explore with the client her multiple roles and responsibilities associated with work and family; clarify related thoughts and feelings. Objective Clarify values and priorities. Target Date: 2023-08-05 Frequency: Biweekly  Progress: 65 Modality: individual  Related Interventions Assign the client to read about progressive muscle relaxation and other calming strategies in relevant books or treatment manuals (e.g., Progressive Relaxation Training by Twana First; Mastery of Your Anxiety and Optometrist by Lucita Ferrara). Refer the client to initiate conjoint therapy with partner. Objective Implement assertiveness skills and limit setting. Target Date: 2023-08-05 Frequency:  Biweekly  Progress: 55-60 Modality: individual  Related Interventions Use behavioral techniques (i.e., education, modeling, role-playing, corrective feedback, positive reinforcement) to teach communication skills, including assertive communication, offering positive feedback, active listening, making positive requests of others for behavior change, and giving negative feedback in an honest and respectful manner. Objective Learn and implement stress management and relaxation techniques to reduce fatigue, anxiety, and depressive symptoms. Target Date: 2023-08-05 Frequency: Biweekly  Progress: 70 Modality: individual  Related Interventions Explore, along with the client, strategies for making her life more manageable and less stressful (e.g., waking up before the children to exercise or have a cup of tea, pack the children's lunches and backpacks the night before). Objective Communicate needs with partner regarding multiple role obligations. Target  Date: 2023-08-05 Frequency: Biweekly  Progress: 30 Modality: individual  Related Interventions Help the client to develop a realistic schedule that outlines the responsibilities of her partner and family members; help the client enlist the commitment of all individuals to the schedule. Objective Learn and implement problem-solving and conflict-resolution skills. Target Date: 2023-08-05 Frequency: Biweekly  Progress: 50 Modality: individual  Related Interventions Assign the client to conduct family meetings regularly (e.g., possibly biweekly or monthly) with children (ages 70 and up) and partner to delegate household responsibilities. Objective Commit to a healthy eating, sleeping, and exercise routine. Target Date: 2023-08-05 Frequency: Biweekly  Progress: 55 Modality: individual  Objective Generate a list of self-care activities and make a commitment to regularly participate in such activities. Target Date: 2023-08-05 Frequency: Biweekly   Progress: 75 Modality: individual  7. Enhance ability to handle effectively life stressors. 8. Increase awareness of own role in relationship conflicts. 9. Increase overall sense of well-being via reduction/elimination of anxiety. 10. Maintain a balance between the multiple demands of motherhood, work, and other life roles and responsibilities. 11. Resolve issues involving low self-esteem or low self-efficacy that contribute to anxiety. Objective Identify precipitating events, thoughts, feelings, and reactions that are believed to contribute to anxiety. Target Date: 2023-08-05 Frequency: Biweekly  Progress: 50 Modality: individual  Related Interventions Educate the client on the relaxing, calming, and balancing benefits of practices such as yoga, meditation, and prayer; provide suggestions on how to get started (e.g., book, video, local gym class). Objective Implement self-care strategies that serve to augment the positive effects of other interventions. Target Date: 2023-08-05 Frequency: Biweekly  Progress: 75 Modality: individual  Related Interventions Elicit the client's specific patterns of thought that contribute to anxious states; aid client in understanding the connection between the identified maladaptive thoughts and anxious feelings. Objective Learn and practice methods of reducing anxiety in a variety of situations. Target Date: 2023-08-05 Frequency: Biweekly  Progress: 75 Modality: individual  Related Interventions Educate the client on thought-stopping techniques and positive reframing in order to preempt anxious reactions. Encourage the use of positive self-talk as a replacement to some of the automatic, negative self-talk being utilized currently. Objective Acknowledge underlying irrational or illogical thought patterns that contribute to anxiety. Target Date: 2023-08-05 Frequency: Biweekly  Progress: 50 Modality: individual  Related Interventions Aid the client in  seeing the connection between a focus on helping others or improving society and a reduction in anxiety (i.e., dwelling on maladaptive thoughts). Objective Begin to refer to an internal standard of performance, morals, achievement, appearance, and so on, rather than sociocultural, parental, spousal, or otherwise externally imposed judgments. Target Date: 2023-08-05 Frequency: Biweekly  Progress: 55 Modality: individual   Diagnosis:Adjustment disorder with mixed anxiety and depressed mood  Plan:  -meet again on Thursday, February 20, 2023 at 11am in-person

## 2023-02-18 ENCOUNTER — Other Ambulatory Visit: Payer: Self-pay | Admitting: Family Medicine

## 2023-02-18 NOTE — Telephone Encounter (Signed)
Attempted call to patient requesting Wegovy at 0.5mg  Called to ask patient if increasing to 1mg  now ? Left voice mail message requesting a return call.

## 2023-02-20 ENCOUNTER — Encounter: Payer: Self-pay | Admitting: Professional

## 2023-02-20 ENCOUNTER — Ambulatory Visit (INDEPENDENT_AMBULATORY_CARE_PROVIDER_SITE_OTHER): Payer: 59 | Admitting: Professional

## 2023-02-20 DIAGNOSIS — F4323 Adjustment disorder with mixed anxiety and depressed mood: Secondary | ICD-10-CM
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
## 2023-03-11 ENCOUNTER — Ambulatory Visit: Payer: 59 | Admitting: Family Medicine

## 2023-03-11 VITALS — BP 107/78 | HR 75 | Resp 18 | Ht 66.0 in | Wt 249.2 lb

## 2023-03-11 DIAGNOSIS — R7303 Prediabetes: Secondary | ICD-10-CM

## 2023-03-11 DIAGNOSIS — Z6841 Body Mass Index (BMI) 40.0 and over, adult: Secondary | ICD-10-CM

## 2023-03-11 LAB — POCT GLYCOSYLATED HEMOGLOBIN (HGB A1C): Hemoglobin A1C: 5.4 % (ref 4.0–5.6)

## 2023-03-11 MED ORDER — SEMAGLUTIDE-WEIGHT MANAGEMENT 2.4 MG/0.75ML ~~LOC~~ SOAJ: 2.4000 mg | SUBCUTANEOUS | 3 refills | Status: DC

## 2023-03-11 NOTE — Assessment & Plan Note (Signed)
Patient A1c today is 5.4 which is decreased from 5.8.  Congratulated patient on decrease sugar in better control.  Continue diet and exercise we will do a follow-up in 3 months to keep a monitor on her prediabetes as well as weight loss status.

## 2023-03-11 NOTE — Assessment & Plan Note (Signed)
Patient doing well from weight management follow-up.  She is currently on Wegovy.  We will increase to 2.4 weekly.  She has been doing well

## 2023-03-11 NOTE — Progress Notes (Signed)
Established patient visit   Patient: Mary Rocha   DOB: 12/08/1975   47 y.o. Female  MRN: [id number] Visit Date: 03/11/2023  Today's healthcare provider: Charlton Amor, DO   Chief Complaint  Patient presents with   Follow-up    Weight loss medication, DM    SUBJECTIVE    Chief Complaint  Patient presents with   Follow-up    Weight loss medication, DM   HPI HPI     Follow-up    Additional comments: Weight loss medication, DM      Last edited by Roselyn Reef, CMA on 03/11/2023  1:15 PM.      Pt presents for follow up on wegovy. She is currenlty on wegovy 1.7mg  and has been tolerating this well. Weight is 249lbs today.   Prediabetes A1c last visit was 5.8 Has been tolerating metformin and Wegovy well  Review of Systems  Constitutional:  Negative for activity change, fatigue and fever.  Respiratory:  Negative for cough and shortness of breath.   Cardiovascular:  Negative for chest pain.  Gastrointestinal:  Negative for abdominal pain.  Genitourinary:  Negative for difficulty urinating.       Current Meds  Medication Sig   metFORMIN (GLUCOPHAGE) 1000 MG tablet Take 1 tablet (1,000 mg total) by mouth 2 (two) times daily with a meal.   Semaglutide-Weight Management 1.7 MG/0.75ML SOAJ Inject 1.7 mg into the skin once a week for 28 days.   [DISCONTINUED] Semaglutide-Weight Management 2.4 MG/0.75ML SOAJ Inject 2.4 mg into the skin once a week.    OBJECTIVE    BP 107/78 (BP Location: Left Arm, Patient Position: Sitting, Cuff Size: Large)   Pulse 75   Resp 18   Ht 5\' 6"  (1.676 m)   Wt 249 lb 4 oz (113.1 kg)   SpO2 98%   BMI 40.23 kg/m   Physical Exam Vitals and nursing note reviewed.  Constitutional:      General: She is not in acute distress.    Appearance: Normal appearance.  HENT:     Head: Normocephalic and atraumatic.     Right Ear: External ear normal.     Left Ear: External ear normal.     Nose: Nose normal.  Eyes:      Conjunctiva/sclera: Conjunctivae normal.  Cardiovascular:     Rate and Rhythm: Normal rate and regular rhythm.  Pulmonary:     Effort: Pulmonary effort is normal.     Breath sounds: Normal breath sounds.  Neurological:     General: No focal deficit present.     Mental Status: She is alert and oriented to person, place, and time.  Psychiatric:        Mood and Affect: Mood normal.        Behavior: Behavior normal.        Thought Content: Thought content normal.        Judgment: Judgment normal.          ASSESSMENT & PLAN    Problem List Items Addressed This Visit       Other   Prediabetes - Primary    Patient A1c today is 5.4 which is decreased from 5.8.  Congratulated patient on decrease sugar in better control.  Continue diet and exercise we will do a follow-up in 3 months to keep a monitor on her prediabetes as well as weight loss status.      Relevant Orders   POCT HgB A1C (Completed)   Class 3 severe  obesity due to excess calories with serious comorbidity and body mass index (BMI) of 40.0 to 44.9 in adult [facility])    Patient doing well from weight management follow-up.  She is currently on Wegovy.  We will increase to 2.4 weekly.  She has been doing well      Relevant Medications   Semaglutide-Weight Management 2.4 MG/0.75ML SOAJ (Start on 04/05/2023)    Return in about 3 months (around 06/11/2023).      Meds ordered this encounter  Medications   Semaglutide-Weight Management 2.4 MG/0.75ML SOAJ    Sig: Inject 2.4 mg into the skin once a week.    Dispense:  3 mL    Refill:  3    Orders Placed This Encounter  Procedures   POCT HgB A1C     Charlton Amor, DO  [facility] at [facility] [phone] (phone) [phone] (fax)  [facility]

## 2023-03-11 NOTE — Patient Instructions (Signed)
A1c was 5.4!!!!!!!  KEEP UP THE GREAT WORK!

## 2023-03-27 ENCOUNTER — Ambulatory Visit: Payer: 59 | Admitting: Professional

## 2023-04-24 ENCOUNTER — Encounter: Payer: Self-pay | Admitting: Professional

## 2023-04-24 ENCOUNTER — Ambulatory Visit (INDEPENDENT_AMBULATORY_CARE_PROVIDER_SITE_OTHER): Payer: 59 | Admitting: Professional

## 2023-04-24 DIAGNOSIS — F4323 Adjustment disorder with mixed anxiety and depressed mood: Secondary | ICD-10-CM | POA: Diagnosis not present

## 2023-04-24 NOTE — Progress Notes (Signed)
[facility] Counselor/Therapist Progress Note  Patient ID: Mary Rocha, MRN: [id number],    Date: 04/24/2023  Time Spent: 56 minutes 1213-109pm  Treatment Type: Individual Therapy  Risk Assessment: Danger to Self:  No Self-injurious Behavior: No Danger to Others: No  Subjective: The patient arrived on time for her in-person appointment.   Issues: 1-personal a-father had stroke and is home but cannot do regular daily activities -he is frustrated at his limitations -the youngest daughter Freida Busman determined he cannot operate the stove -he has been going to [employer] for almost four months -Alannah stirs the pot with her oldest sister Joni Reining and with their dad -Joni Reining did not have a relationship prior ot his stroke but is now actively involved -Joni Reining gives one night a week to help with father's laundry, have dinner, and go to [employer] lacrosse game 2-physical -pt is menopausal and dealing with night sweats -stress of dealing with  3-marital -anything that is appealing to the pt her husband doesn't like -pt wanted to home school their daughter to allow more flexibility -pt has not been happy with Thayer Ohm or a while -she is considering whether she is going to stay in the marriage -she doesn't want anything to happen to him but is okay without seeing him -pt is uncertain what will cause her to leave -she worries about Thayer Ohm' controlling behavior and how that would work for [employer] if the marriage ended -pt is tired of the same old same old -her desire to work on her marriages comes and goes; today she is not interested in repairing the marriage  2023 Treatment Plan (reviewed 10/01/2022) Problems Addressed  Anxiety, Balancing Work and Family/Multiple Roles, Depression, Partner Relational Problems  Goals 1. Address and eliminate maladaptive thought processes that lead to anxious responses. 2. Develop a realistic perspective regarding demands and obligations of  multiple roles and their completion. 3. Develop a sense of own personal power and self-esteem within the relationship. 4. Develop healthy cognitive mechanisms to facilitate positive attitudes and beliefs about self within the context of one's environment to mitigate depressive symptoms. Objective Articulate self-care methods to prevent and/or manage depression in the future; create a list of 10 coping methods for future use. Target Date: 2023-08-05 Frequency: Biweekly  Progress: 70 (gf time, coffee dates, MIL pick up [name], carpool, exercise, time w [name]) Modality: individual  Objective Increase the level of physical exercise. Target Date: 2023-08-05 Frequency: Biweekly  Progress: 80 Modality: individual  Objective Implement assertiveness to communicate needs and desires to others. Target Date: 2023-08-05 Frequency: Biweekly  Progress: 40 (certain people, primarily Thayer Ohm, she doesn't know how to deal with frustration with having the conversation) Modality: individual  Related Interventions Offer the client booster sessions as necessary. Objective Increase the frequency of engaging in pleasant activities. Target Date: 2023-08-05 Frequency: Biweekly  Progress: 75 Modality: individual  Related Interventions Empower the client to connect with a positive female role model through bibliotherapy and/or with family members, friends, or Insurance risk surveyor; review how the client may apply the adaptive behaviors of the role model to her own life. Objective Identify and replace cognitive self-talk that supports depression. Target Date: 2023-08-05 Frequency: Biweekly  Progress: 55 Modality: individual  Related Interventions Do "behavioral experiments" in which depressive automatic thoughts are treated as hypotheses/predictions, reality-based alternative hypotheses/ predictions are generated, and both are tested against the client's past, present, and/or future experiences. Encourage the client to  discuss cognitive distortions, including automatic thoughts (e.g., negative view of self, future, experience) and negative schemas (  e.g., core beliefs about self and others based on earlier childhood experiences); assess frequency of negative self-statements associated with depression. Reinforce the client's positive, reality-based cognitive messages that enhance self-confidence and increase adaptive action (see "Positive Self-Talk" in the Adult Psychotherapy Homework Planner, 2nd ed. by Stephannie Li). Objective Articulate signs and symptoms of depression in current life experiences. Target Date: 2023-08-05 Frequency: Biweekly  Progress: 35 Modality: individual  Related Interventions Encourage the client to share her feelings of depression to gain an insight into precipitating events and implications of symptoms; normalize her feelings of depression. Encourage the client to describe current and childhood experiences associated with key relationships (e.g., family-of-origin, peer and school relationships, dating relationships, female role models, extended family relationships), roles (e.g., partner, caretaker, worker, Consulting civil engineer), and cultural experiences (e.g., gender roles) that may contribute to depression. 5. Develop the necessary skills for effective, open communication and mutually satisfying intimacy. Objective Each partner identifies her/his own role in the conflicts. Target Date: 2023-08-05 Frequency: Biweekly  Progress: 30-35 (pt's perspective for herself, says Thayer Ohm doesn't articulate but admits he knows) Modality: individual  Related Interventions Assign the couple to set aside daily 15 minutes that are distraction free during which they can communicate about nonconflictual issues; practice during the therapy session, assisting each partner in clarifying communication and expression of feelings. Seek a commitment from each partner to begin to work on changing specific behaviors on her/his list and  on the list of the partner for her/him. Objective Increase the frequency and quality of communication with the partner. Target Date: 2023-08-05 Frequency: Biweekly  Progress: 40 Modality: individual  Related Interventions Validate the client's disappointment and disillusionment with the current relationship; assist the client with expressing her expectations and hopes regarding the relationship. Objective Express thoughts and feelings regarding the relationship in a direct manner. Target Date: 2023-08-05 Frequency: Biweekly  Progress: 55 Modality: individual  Related Interventions Use behavioral techniques (e.g., education, modeling, role-playing, corrective feedback, positive reinforcement) to teach the couple problem-solving and conflict-resolution skills, including defining the problem constructively and specifically, brainstorming options, compromise, choosing options, implementing a plan, and evaluating the results. Objective Both partners agree to a "time out" signal that either partner may give to stop interaction that may escalate into abuse. Target Date: 2023-08-05 Frequency: Biweekly  Progress: 60 implementing, following through 40 Modality: individual  Objective Increase time spent in enjoyable contact with the partner. Target Date: 2023-08-05 Frequency: Biweekly  Progress: 45 Modality: individual  6. Eliminate depressive and anxiety symptoms associated with trying to balance multiple roles. Objective Describe role and responsibilities associated with work and family and related thoughts, feelings, and behaviors. Target Date: 2023-08-05 Frequency: Biweekly  Progress: 30 Modality: individual  Related Interventions Explore with the client her multiple roles and responsibilities associated with work and family; clarify related thoughts and feelings. Objective Clarify values and priorities. Target Date: 2023-08-05 Frequency: Biweekly  Progress: 65 Modality: individual  Related  Interventions Assign the client to read about progressive muscle relaxation and other calming strategies in relevant books or treatment manuals (e.g., Progressive Relaxation Training by Twana First; Mastery of Your Anxiety and Optometrist by Lucita Ferrara). Refer the client to initiate conjoint therapy with partner. Objective Implement assertiveness skills and limit setting. Target Date: 2023-08-05 Frequency: Biweekly  Progress: 55-60 Modality: individual  Related Interventions Use behavioral techniques (i.e., education, modeling, role-playing, corrective feedback, positive reinforcement) to teach communication skills, including assertive communication, offering positive feedback, active listening, making positive requests of others for behavior change, and giving  negative feedback in an honest and respectful manner. Objective Learn and implement stress management and relaxation techniques to reduce fatigue, anxiety, and depressive symptoms. Target Date: 2023-08-05 Frequency: Biweekly  Progress: 70 Modality: individual  Related Interventions Explore, along with the client, strategies for making her life more manageable and less stressful (e.g., waking up before the children to exercise or have a cup of tea, pack the children's lunches and backpacks the night before). Objective Communicate needs with partner regarding multiple role obligations. Target Date: 2023-08-05 Frequency: Biweekly  Progress: 30 Modality: individual  Related Interventions Help the client to develop a realistic schedule that outlines the responsibilities of her partner and family members; help the client enlist the commitment of all individuals to the schedule. Objective Learn and implement problem-solving and conflict-resolution skills. Target Date: 2023-08-05 Frequency: Biweekly  Progress: 50 Modality: individual  Related Interventions Assign the client to conduct family  meetings regularly (e.g., possibly biweekly or monthly) with children (ages 52 and up) and partner to delegate household responsibilities. Objective Commit to a healthy eating, sleeping, and exercise routine. Target Date: 2023-08-05 Frequency: Biweekly  Progress: 55 Modality: individual  Objective Generate a list of self-care activities and make a commitment to regularly participate in such activities. Target Date: 2023-08-05 Frequency: Biweekly  Progress: 75 Modality: individual  7. Enhance ability to handle effectively life stressors. 8. Increase awareness of own role in relationship conflicts. 9. Increase overall sense of well-being via reduction/elimination of anxiety. 10. Maintain a balance between the multiple demands of motherhood, work, and other life roles and responsibilities. 11. Resolve issues involving low self-esteem or low self-efficacy that contribute to anxiety. Objective Identify precipitating events, thoughts, feelings, and reactions that are believed to contribute to anxiety. Target Date: 2023-08-05 Frequency: Biweekly  Progress: 50 Modality: individual  Related Interventions Educate the client on the relaxing, calming, and balancing benefits of practices such as yoga, meditation, and prayer; provide suggestions on how to get started (e.g., book, video, local gym class). Objective Implement self-care strategies that serve to augment the positive effects of other interventions. Target Date: 2023-08-05 Frequency: Biweekly  Progress: 75 Modality: individual  Related Interventions Elicit the client's specific patterns of thought that contribute to anxious states; aid client in understanding the connection between the identified maladaptive thoughts and anxious feelings. Objective Learn and practice methods of reducing anxiety in a variety of situations. Target Date: 2023-08-05 Frequency: Biweekly  Progress: 75 Modality: individual  Related Interventions Educate the  client on thought-stopping techniques and positive reframing in order to preempt anxious reactions. Encourage the use of positive self-talk as a replacement to some of the automatic, negative self-talk being utilized currently. Objective Acknowledge underlying irrational or illogical thought patterns that contribute to anxiety. Target Date: 2023-08-05 Frequency: Biweekly  Progress: 50 Modality: individual  Related Interventions Aid the client in seeing the connection between a focus on helping others or improving society and a reduction in anxiety (i.e., dwelling on maladaptive thoughts). Objective Begin to refer to an internal standard of performance, morals, achievement, appearance, and so on, rather than sociocultural, parental, spousal, or otherwise externally imposed judgments. Target Date: 2023-08-05 Frequency: Biweekly  Progress: 55 Modality: individual   Diagnosis:No diagnosis found.  Plan:  -meet again on Friday, May 02, 2023 at 11am virtually

## 2023-04-28 ENCOUNTER — Encounter: Payer: Self-pay | Admitting: Family Medicine

## 2023-05-02 ENCOUNTER — Ambulatory Visit (INDEPENDENT_AMBULATORY_CARE_PROVIDER_SITE_OTHER): Payer: 59 | Admitting: Professional

## 2023-05-02 ENCOUNTER — Encounter: Payer: Self-pay | Admitting: Professional

## 2023-05-02 ENCOUNTER — Telehealth: Payer: Self-pay

## 2023-05-02 DIAGNOSIS — F4323 Adjustment disorder with mixed anxiety and depressed mood: Secondary | ICD-10-CM

## 2023-05-02 NOTE — Telephone Encounter (Signed)
Initiated Prior authorization RUE:[id number] 2.4MG /0.75ML auto-injectors Via: Covermymeds Case/Key:[id number] Status: approved  as of 05/02/23 Reason:Authorization Expiration Date: May 01, 2024 Notified Pt via: Clinical cytogeneticist

## 2023-05-02 NOTE — Progress Notes (Signed)
[facility] Counselor/Therapist Progress Note  Patient ID: Mary Rocha, MRN: [id number],    Date: 05/02/2023  Time Spent: 61 minutes 1102am-1203pm  Treatment Type: Individual Therapy  Risk Assessment: Danger to Self:  No Self-injurious Behavior: No Danger to Others: No  Subjective: This session was held via video teletherapy. The patient consented to video teletherapy and was located at her home during this session. She is aware it is the responsibility of the patient to secure confidentiality on her end of the session. The provider was in a private home office for the duration of this session.    The patient arrived on time for her Caregility session  Issues: 1-physical -pt is menopausal and dealing with night sweats -stress of dealing with  2-marital -patient wants to learn how to communicate more effectively with husband Ed Blalock became angry with er when she stayed out longer than expected with [name] at the gymnastics season pool party -he blamed her for not caring about him and the fact that he just had knee surgery -pt frustrated because she does not know how to communicate with him 3-Communication a-talk openly with Thayer Ohm and ask how he would b-pt has talked to Clinician about not being able to continue in marriage -pt admits that these thoughts do cause her to struggle with communication c-her husband has acknowledged that their marriage has gone downhill d-strategies to help her improve her reactions to [facility]  2023 Treatment Plan (reviewed 10/01/2022) Problems Addressed  Anxiety, Balancing Work and Family/Multiple Roles, Depression, Partner Relational Problems  Goals 1. Address and eliminate maladaptive thought processes that lead to anxious responses. 2. Develop a realistic perspective regarding demands and obligations of multiple roles and their completion. 3. Develop a sense of own personal power and self-esteem within the  relationship. 4. Develop healthy cognitive mechanisms to facilitate positive attitudes and beliefs about self within the context of one's environment to mitigate depressive symptoms. Objective Articulate self-care methods to prevent and/or manage depression in the future; create a list of 10 coping methods for future use. Target Date: 2023-08-05 Frequency: Biweekly  Progress: 70 (gf time, coffee dates, MIL pick up [name], carpool, exercise, time w [name]) Modality: individual  Objective Increase the level of physical exercise. Target Date: 2023-08-05 Frequency: Biweekly  Progress: 80 Modality: individual  Objective Implement assertiveness to communicate needs and desires to others. Target Date: 2023-08-05 Frequency: Biweekly  Progress: 40 (certain people, primarily Thayer Ohm, she doesn't know how to deal with frustration with having the conversation) Modality: individual  Related Interventions Offer the client booster sessions as necessary. Objective Increase the frequency of engaging in pleasant activities. Target Date: 2023-08-05 Frequency: Biweekly  Progress: 75 Modality: individual  Related Interventions Empower the client to connect with a positive female role model through bibliotherapy and/or with family members, friends, or Insurance risk surveyor; review how the client may apply the adaptive behaviors of the role model to her own life. Objective Identify and replace cognitive self-talk that supports depression. Target Date: 2023-08-05 Frequency: Biweekly  Progress: 55 Modality: individual  Related Interventions Do "behavioral experiments" in which depressive automatic thoughts are treated as hypotheses/predictions, reality-based alternative hypotheses/ predictions are generated, and both are tested against the client's past, present, and/or future experiences. Encourage the client to discuss cognitive distortions, including automatic thoughts (e.g., negative view of self, future,  experience) and negative schemas (e.g., core beliefs about self and others based on earlier childhood experiences); assess frequency of negative self-statements associated with depression. Reinforce the client's positive, reality-based cognitive  messages that enhance self-confidence and increase adaptive action (see "Positive Self-Talk" in the Adult Psychotherapy Homework Planner, 2nd ed. by Stephannie Li). Objective Articulate signs and symptoms of depression in current life experiences. Target Date: 2023-08-05 Frequency: Biweekly  Progress: 35 Modality: individual  Related Interventions Encourage the client to share her feelings of depression to gain an insight into precipitating events and implications of symptoms; normalize her feelings of depression. Encourage the client to describe current and childhood experiences associated with key relationships (e.g., family-of-origin, peer and school relationships, dating relationships, female role models, extended family relationships), roles (e.g., partner, caretaker, worker, Consulting civil engineer), and cultural experiences (e.g., gender roles) that may contribute to depression. 5. Develop the necessary skills for effective, open communication and mutually satisfying intimacy. Objective Each partner identifies her/his own role in the conflicts. Target Date: 2023-08-05 Frequency: Biweekly  Progress: 30-35 (pt's perspective for herself, says Thayer Ohm doesn't articulate but admits he knows) Modality: individual  Related Interventions Assign the couple to set aside daily 15 minutes that are distraction free during which they can communicate about nonconflictual issues; practice during the therapy session, assisting each partner in clarifying communication and expression of feelings. Seek a commitment from each partner to begin to work on changing specific behaviors on her/his list and on the list of the partner for her/him. Objective Increase the frequency and quality of  communication with the partner. Target Date: 2023-08-05 Frequency: Biweekly  Progress: 40 Modality: individual  Related Interventions Validate the client's disappointment and disillusionment with the current relationship; assist the client with expressing her expectations and hopes regarding the relationship. Objective Express thoughts and feelings regarding the relationship in a direct manner. Target Date: 2023-08-05 Frequency: Biweekly  Progress: 55 Modality: individual  Related Interventions Use behavioral techniques (e.g., education, modeling, role-playing, corrective feedback, positive reinforcement) to teach the couple problem-solving and conflict-resolution skills, including defining the problem constructively and specifically, brainstorming options, compromise, choosing options, implementing a plan, and evaluating the results. Objective Both partners agree to a "time out" signal that either partner may give to stop interaction that may escalate into abuse. Target Date: 2023-08-05 Frequency: Biweekly  Progress: 60 implementing, following through 40 Modality: individual  Objective Increase time spent in enjoyable contact with the partner. Target Date: 2023-08-05 Frequency: Biweekly  Progress: 45 Modality: individual  6. Eliminate depressive and anxiety symptoms associated with trying to balance multiple roles. Objective Describe role and responsibilities associated with work and family and related thoughts, feelings, and behaviors. Target Date: 2023-08-05 Frequency: Biweekly  Progress: 30 Modality: individual  Related Interventions Explore with the client her multiple roles and responsibilities associated with work and family; clarify related thoughts and feelings. Objective Clarify values and priorities. Target Date: 2023-08-05 Frequency: Biweekly  Progress: 65 Modality: individual  Related Interventions Assign the client to read about progressive muscle relaxation and other  calming strategies in relevant books or treatment manuals (e.g., Progressive Relaxation Training by Twana First; Mastery of Your Anxiety and Optometrist by Lucita Ferrara). Refer the client to initiate conjoint therapy with partner. Objective Implement assertiveness skills and limit setting. Target Date: 2023-08-05 Frequency: Biweekly  Progress: 55-60 Modality: individual  Related Interventions Use behavioral techniques (i.e., education, modeling, role-playing, corrective feedback, positive reinforcement) to teach communication skills, including assertive communication, offering positive feedback, active listening, making positive requests of others for behavior change, and giving negative feedback in an honest and respectful manner. Objective Learn and implement stress management and relaxation techniques to reduce fatigue, anxiety, and depressive symptoms. Target Date:  2023-08-05 Frequency: Biweekly  Progress: 70 Modality: individual  Related Interventions Explore, along with the client, strategies for making her life more manageable and less stressful (e.g., waking up before the children to exercise or have a cup of tea, pack the children's lunches and backpacks the night before). Objective Communicate needs with partner regarding multiple role obligations. Target Date: 2023-08-05 Frequency: Biweekly  Progress: 30 Modality: individual  Related Interventions Help the client to develop a realistic schedule that outlines the responsibilities of her partner and family members; help the client enlist the commitment of all individuals to the schedule. Objective Learn and implement problem-solving and conflict-resolution skills. Target Date: 2023-08-05 Frequency: Biweekly  Progress: 50 Modality: individual  Related Interventions Assign the client to conduct family meetings regularly (e.g., possibly biweekly or monthly) with children (ages 73 and up) and  partner to delegate household responsibilities. Objective Commit to a healthy eating, sleeping, and exercise routine. Target Date: 2023-08-05 Frequency: Biweekly  Progress: 55 Modality: individual  Objective Generate a list of self-care activities and make a commitment to regularly participate in such activities. Target Date: 2023-08-05 Frequency: Biweekly  Progress: 75 Modality: individual  7. Enhance ability to handle effectively life stressors. 8. Increase awareness of own role in relationship conflicts. 9. Increase overall sense of well-being via reduction/elimination of anxiety. 10. Maintain a balance between the multiple demands of motherhood, work, and other life roles and responsibilities. 11. Resolve issues involving low self-esteem or low self-efficacy that contribute to anxiety. Objective Identify precipitating events, thoughts, feelings, and reactions that are believed to contribute to anxiety. Target Date: 2023-08-05 Frequency: Biweekly  Progress: 50 Modality: individual  Related Interventions Educate the client on the relaxing, calming, and balancing benefits of practices such as yoga, meditation, and prayer; provide suggestions on how to get started (e.g., book, video, local gym class). Objective Implement self-care strategies that serve to augment the positive effects of other interventions. Target Date: 2023-08-05 Frequency: Biweekly  Progress: 75 Modality: individual  Related Interventions Elicit the client's specific patterns of thought that contribute to anxious states; aid client in understanding the connection between the identified maladaptive thoughts and anxious feelings. Objective Learn and practice methods of reducing anxiety in a variety of situations. Target Date: 2023-08-05 Frequency: Biweekly  Progress: 75 Modality: individual  Related Interventions Educate the client on thought-stopping techniques and positive reframing in order to preempt anxious  reactions. Encourage the use of positive self-talk as a replacement to some of the automatic, negative self-talk being utilized currently. Objective Acknowledge underlying irrational or illogical thought patterns that contribute to anxiety. Target Date: 2023-08-05 Frequency: Biweekly  Progress: 50 Modality: individual  Related Interventions Aid the client in seeing the connection between a focus on helping others or improving society and a reduction in anxiety (i.e., dwelling on maladaptive thoughts). Objective Begin to refer to an internal standard of performance, morals, achievement, appearance, and so on, rather than sociocultural, parental, spousal, or otherwise externally imposed judgments. Target Date: 2023-08-05 Frequency: Biweekly  Progress: 55 Modality: individual   Diagnosis:Adjustment disorder with mixed anxiety and depressed mood  Plan:  -try to set up at least a bi-wkly dinner -plan for anniversary on the 29th -make rules for far fighting -meet again on Thursday, May 29, 2023 at 11am in-person

## 2023-05-29 ENCOUNTER — Encounter: Payer: Self-pay | Admitting: Professional

## 2023-05-29 ENCOUNTER — Ambulatory Visit: Payer: 59 | Admitting: Professional

## 2023-05-29 DIAGNOSIS — F4323 Adjustment disorder with mixed anxiety and depressed mood: Secondary | ICD-10-CM

## 2023-05-29 NOTE — Progress Notes (Signed)
[facility] Counselor/Therapist Progress Note  Patient ID: Mary Rocha, MRN: [id number],    Date: 05/29/2023  Time Spent: 53 minutes 1108-1201pm  Treatment Type: Individual Therapy  Risk Assessment: Danger to Self:  No Self-injurious Behavior: No Danger to Others: No  Subjective: The patient arrived on time for her in-person session.  Issues: 1-homework -try to set up at least a bi-wkly dinner -plan for anniversary on the 29th -make rules for far fighting 2-death of favorite aunt  -aunt diagnosed with stage four lung and liver cancer and told 1-2 months to live -pt make plans to go this past Wednesday -realization that this puts them in the senior generation 3-marital -differences between partners caused tension -she needs him to hear her -had [name] not been in the picture she would have left her spouse years ago -consider implications of leaving and when would be the right time to   2023 Treatment Plan (reviewed 10/01/2022) Problems Addressed  Anxiety, Balancing Work and Family/Multiple Roles, Depression, Partner Relational Problems  Goals 1. Address and eliminate maladaptive thought processes that lead to anxious responses. 2. Develop a realistic perspective regarding demands and obligations of multiple roles and their completion. 3. Develop a sense of own personal power and self-esteem within the relationship. 4. Develop healthy cognitive mechanisms to facilitate positive attitudes and beliefs about self within the context of one's environment to mitigate depressive symptoms. Objective Articulate self-care methods to prevent and/or manage depression in the future; create a list of 10 coping methods for future use. Target Date: 2023-08-05 Frequency: Biweekly  Progress: 70 (gf time, coffee dates, MIL pick up [name], carpool, exercise, time w [name]) Modality: individual  Objective Increase the level of physical exercise. Target Date: 2023-08-05  Frequency: Biweekly  Progress: 80 Modality: individual  Objective Implement assertiveness to communicate needs and desires to others. Target Date: 2023-08-05 Frequency: Biweekly  Progress: 40 (certain people, primarily Thayer Ohm, she doesn't know how to deal with frustration with having the conversation) Modality: individual  Related Interventions Offer the client booster sessions as necessary. Objective Increase the frequency of engaging in pleasant activities. Target Date: 2023-08-05 Frequency: Biweekly  Progress: 75 Modality: individual  Related Interventions Empower the client to connect with a positive female role model through bibliotherapy and/or with family members, friends, or Insurance risk surveyor; review how the client may apply the adaptive behaviors of the role model to her own life. Objective Identify and replace cognitive self-talk that supports depression. Target Date: 2023-08-05 Frequency: Biweekly  Progress: 55 Modality: individual  Related Interventions Do "behavioral experiments" in which depressive automatic thoughts are treated as hypotheses/predictions, reality-based alternative hypotheses/ predictions are generated, and both are tested against the client's past, present, and/or future experiences. Encourage the client to discuss cognitive distortions, including automatic thoughts (e.g., negative view of self, future, experience) and negative schemas (e.g., core beliefs about self and others based on earlier childhood experiences); assess frequency of negative self-statements associated with depression. Reinforce the client's positive, reality-based cognitive messages that enhance self-confidence and increase adaptive action (see "Positive Self-Talk" in the Adult Psychotherapy Homework Planner, 2nd ed. by Stephannie Li). Objective Articulate signs and symptoms of depression in current life experiences. Target Date: 2023-08-05 Frequency: Biweekly  Progress: 35 Modality: individual   Related Interventions Encourage the client to share her feelings of depression to gain an insight into precipitating events and implications of symptoms; normalize her feelings of depression. Encourage the client to describe current and childhood experiences associated with key relationships (e.g., family-of-origin, peer and school relationships, dating  relationships, female role models, extended family relationships), roles (e.g., partner, caretaker, worker, student), and cultural experiences (e.g., gender roles) that may contribute to depression. 5. Develop the necessary skills for effective, open communication and mutually satisfying intimacy. Objective Each partner identifies her/his own role in the conflicts. Target Date: 2023-08-05 Frequency: Biweekly  Progress: 30-35 (pt's perspective for herself, says Thayer Ohm doesn't articulate but admits he knows) Modality: individual  Related Interventions Assign the couple to set aside daily 15 minutes that are distraction free during which they can communicate about nonconflictual issues; practice during the therapy session, assisting each partner in clarifying communication and expression of feelings. Seek a commitment from each partner to begin to work on changing specific behaviors on her/his list and on the list of the partner for her/him. Objective Increase the frequency and quality of communication with the partner. Target Date: 2023-08-05 Frequency: Biweekly  Progress: 40 Modality: individual  Related Interventions Validate the client's disappointment and disillusionment with the current relationship; assist the client with expressing her expectations and hopes regarding the relationship. Objective Express thoughts and feelings regarding the relationship in a direct manner. Target Date: 2023-08-05 Frequency: Biweekly  Progress: 55 Modality: individual  Related Interventions Use behavioral techniques (e.g., education, modeling, role-playing,  corrective feedback, positive reinforcement) to teach the couple problem-solving and conflict-resolution skills, including defining the problem constructively and specifically, brainstorming options, compromise, choosing options, implementing a plan, and evaluating the results. Objective Both partners agree to a "time out" signal that either partner may give to stop interaction that may escalate into abuse. Target Date: 2023-08-05 Frequency: Biweekly  Progress: 60 implementing, following through 40 Modality: individual  Objective Increase time spent in enjoyable contact with the partner. Target Date: 2023-08-05 Frequency: Biweekly  Progress: 45 Modality: individual  6. Eliminate depressive and anxiety symptoms associated with trying to balance multiple roles. Objective Describe role and responsibilities associated with work and family and related thoughts, feelings, and behaviors. Target Date: 2023-08-05 Frequency: Biweekly  Progress: 30 Modality: individual  Related Interventions Explore with the client her multiple roles and responsibilities associated with work and family; clarify related thoughts and feelings. Objective Clarify values and priorities. Target Date: 2023-08-05 Frequency: Biweekly  Progress: 65 Modality: individual  Related Interventions Assign the client to read about progressive muscle relaxation and other calming strategies in relevant books or treatment manuals (e.g., Progressive Relaxation Training by Twana First; Mastery of Your Anxiety and Optometrist by Lucita Ferrara). Refer the client to initiate conjoint therapy with partner. Objective Implement assertiveness skills and limit setting. Target Date: 2023-08-05 Frequency: Biweekly  Progress: 55-60 Modality: individual  Related Interventions Use behavioral techniques (i.e., education, modeling, role-playing, corrective feedback, positive reinforcement) to teach communication  skills, including assertive communication, offering positive feedback, active listening, making positive requests of others for behavior change, and giving negative feedback in an honest and respectful manner. Objective Learn and implement stress management and relaxation techniques to reduce fatigue, anxiety, and depressive symptoms. Target Date: 2023-08-05 Frequency: Biweekly  Progress: 70 Modality: individual  Related Interventions Explore, along with the client, strategies for making her life more manageable and less stressful (e.g., waking up before the children to exercise or have a cup of tea, pack the children's lunches and backpacks the night before). Objective Communicate needs with partner regarding multiple role obligations. Target Date: 2023-08-05 Frequency: Biweekly  Progress: 30 Modality: individual  Related Interventions Help the client to develop a realistic schedule that outlines the responsibilities of her partner and family members; help  the client enlist the commitment of all individuals to the schedule. Objective Learn and implement problem-solving and conflict-resolution skills. Target Date: 2023-08-05 Frequency: Biweekly  Progress: 50 Modality: individual  Related Interventions Assign the client to conduct family meetings regularly (e.g., possibly biweekly or monthly) with children (ages 16 and up) and partner to delegate household responsibilities. Objective Commit to a healthy eating, sleeping, and exercise routine. Target Date: 2023-08-05 Frequency: Biweekly  Progress: 55 Modality: individual  Objective Generate a list of self-care activities and make a commitment to regularly participate in such activities. Target Date: 2023-08-05 Frequency: Biweekly  Progress: 75 Modality: individual  7. Enhance ability to handle effectively life stressors. 8. Increase awareness of own role in relationship conflicts. 9. Increase overall sense of well-being via  reduction/elimination of anxiety. 10. Maintain a balance between the multiple demands of motherhood, work, and other life roles and responsibilities. 11. Resolve issues involving low self-esteem or low self-efficacy that contribute to anxiety. Objective Identify precipitating events, thoughts, feelings, and reactions that are believed to contribute to anxiety. Target Date: 2023-08-05 Frequency: Biweekly  Progress: 50 Modality: individual  Related Interventions Educate the client on the relaxing, calming, and balancing benefits of practices such as yoga, meditation, and prayer; provide suggestions on how to get started (e.g., book, video, local gym class). Objective Implement self-care strategies that serve to augment the positive effects of other interventions. Target Date: 2023-08-05 Frequency: Biweekly  Progress: 75 Modality: individual  Related Interventions Elicit the client's specific patterns of thought that contribute to anxious states; aid client in understanding the connection between the identified maladaptive thoughts and anxious feelings. Objective Learn and practice methods of reducing anxiety in a variety of situations. Target Date: 2023-08-05 Frequency: Biweekly  Progress: 75 Modality: individual  Related Interventions Educate the client on thought-stopping techniques and positive reframing in order to preempt anxious reactions. Encourage the use of positive self-talk as a replacement to some of the automatic, negative self-talk being utilized currently. Objective Acknowledge underlying irrational or illogical thought patterns that contribute to anxiety. Target Date: 2023-08-05 Frequency: Biweekly  Progress: 50 Modality: individual  Related Interventions Aid the client in seeing the connection between a focus on helping others or improving society and a reduction in anxiety (i.e., dwelling on maladaptive thoughts). Objective Begin to refer to an internal standard of  performance, morals, achievement, appearance, and so on, rather than sociocultural, parental, spousal, or otherwise externally imposed judgments. Target Date: 2023-08-05 Frequency: Biweekly  Progress: 55 Modality: individual   Diagnosis:Adjustment disorder with mixed anxiety and depressed mood  Plan:  -track how many days are ones you want to stay and how many will make you ready to leave -meet again on Thursday, June 12, 2023 at 11am in-person

## 2023-06-11 ENCOUNTER — Ambulatory Visit: Payer: 59 | Admitting: Family Medicine

## 2023-06-11 NOTE — Progress Notes (Deleted)
     Established patient visit   Patient: Mary Rocha   DOB: 18-Oct-1975   47 y.o. Female  MRN: [id number] Visit Date: 06/11/2023  Today's healthcare provider: Charlton Amor, DO   No chief complaint on file.   SUBJECTIVE   No chief complaint on file.  HPI   Pt here to follow up on weight management and prediabetes  Prediabetes  - A1c at last visit was 5.4  Weight management - on wegovy 2.4mg   - weight loss significant for   Review of Systems  Constitutional:  Negative for activity change, fatigue and fever.  Respiratory:  Negative for cough and shortness of breath.   Cardiovascular:  Negative for chest pain.  Gastrointestinal:  Negative for abdominal pain.  Genitourinary:  Negative for difficulty urinating.       No outpatient medications have been marked as taking for the 06/11/23 encounter (Appointment) with Charlton Amor, DO.    OBJECTIVE    There were no vitals taken for this visit.  Physical Exam     ASSESSMENT & PLAN    Problem List Items Addressed This Visit   None   No follow-ups on file.      No orders of the defined types were placed in this encounter.   No orders of the defined types were placed in this encounter.    Charlton Amor, DO  [facility] at [facility] [phone] (phone) [phone] (fax)  [facility]

## 2023-06-26 ENCOUNTER — Ambulatory Visit: Payer: 59 | Admitting: Professional

## 2023-06-26 ENCOUNTER — Encounter: Payer: Self-pay | Admitting: Professional

## 2023-06-26 DIAGNOSIS — F4323 Adjustment disorder with mixed anxiety and depressed mood: Secondary | ICD-10-CM | POA: Diagnosis not present

## 2023-06-26 NOTE — Progress Notes (Signed)
[facility] Counselor/Therapist Progress Note  Patient ID: Mary Rocha, MRN: [id number],    Date: 06/26/2023  Time Spent: 45 minutes 1103-1148pm  Treatment Type: Individual Therapy  Risk Assessment: Danger to Self:  No Self-injurious Behavior: No Danger to Others: No  Subjective: The patient arrived on time for her in-person session. 1-homework Issues track how many days are ones you want to stay and how many will make you ready to leave -pt tracked for a little bit -some days she did not see longer than ten minutes 2-mood -depressed and unmotivated -apathetic and disinterested in her work and this is her favorite time of year -triggers include loss of aunt, realization that things are changing and Mary Rocha is growing, not having seen mom in the summer as usual -pt wondered if medication would be appropriate   -suggested pt has a mild depression but she could speak with her PCP and ask for something to help her feel more motivated and less depressed   2-death of favorite aunt  -patient struggling with aunt's death -pt reports feeling indifferent and does not like feeling sadness as expected when one grieves -pt admits other stressors include Mary Rocha's gymnastic competition are beginning; she practices 5/wk and competes on weekends -pt has very little time to work on her marriage but 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 Address and eliminate maladaptive thought processes that lead to anxious responses. 2. Develop a realistic perspective regarding demands and obligations of multiple roles and their completion. 3. Develop a sense of own personal power and self-esteem within the relationship. 4. Develop healthy cognitive mechanisms to facilitate positive  attitudes and beliefs about self within the context of one's environment to mitigate depressive symptoms. Objective Articulate self-care methods to prevent and/or manage depression in the future; create a list of 10 coping methods for future use. Target Date: 2023-08-05 Frequency: Biweekly  Progress: 70 (gf time, coffee dates, MIL pick up Mary Rocha, carpool, exercise, time w Mary Rocha) Modality: individual  Objective Increase the level of physical exercise. Target Date: 2023-08-05 Frequency: Biweekly  Progress: 80 Modality: individual  Objective Implement assertiveness to communicate needs and desires to others. Target Date: 2023-08-05 Frequency: Biweekly  Progress: 40 (certain people, primarily Mary Rocha, she doesn't know how to deal with frustration with having the conversation) Modality: individual  Related Interventions Offer the client booster sessions as necessary. Objective Increase the frequency of engaging in pleasant activities. Target Date: 2023-08-05 Frequency: Biweekly  Progress: 75 Modality: individual  Related Interventions Empower the client to connect with a positive female role model through bibliotherapy and/or with family members, friends, or Insurance risk surveyor; review how the client may apply the adaptive behaviors of the role model to her own life. Objective Identify and replace cognitive self-talk that supports depression. Target Date: 2023-08-05 Frequency: Biweekly  Progress: 55 Modality: individual  Related Interventions Do "behavioral experiments" in which depressive automatic thoughts are treated as hypotheses/predictions, reality-based alternative hypotheses/ predictions are generated, and both are tested against the client's past, present, and/or future experiences. Encourage the client to discuss cognitive distortions, including automatic thoughts (e.g., negative view of self, future, experience) and negative schemas (e.g., core beliefs about self and others based on  earlier childhood experiences); assess frequency of negative self-statements associated with depression. Reinforce the client's positive, reality-based cognitive messages that enhance self-confidence and  increase adaptive action (see "Positive Self-Talk" in the Adult Psychotherapy Homework Planner, 2nd ed. by Stephannie Li). Objective Articulate signs and symptoms of depression in current life experiences. Target Date: 2023-08-05 Frequency: Biweekly  Progress: 35 Modality: individual  Related Interventions Encourage the client to share her feelings of depression to gain an insight into precipitating events and implications of symptoms; normalize her feelings of depression. Encourage the client to describe current and childhood experiences associated with key relationships (e.g., family-of-origin, peer and school relationships, dating relationships, female role models, extended family relationships), roles (e.g., partner, caretaker, worker, Consulting civil engineer), and cultural experiences (e.g., gender roles) that may contribute to depression. 5. Develop the necessary skills for effective, open communication and mutually satisfying intimacy. Objective Each partner identifies her/his own role in the conflicts. Target Date: 2023-08-05 Frequency: Biweekly  Progress: 30-35 (pt's perspective for herself, says Mary Rocha doesn't articulate but admits he knows) Modality: individual  Related Interventions Assign the couple to set aside daily 15 minutes that are distraction free during which they can communicate about nonconflictual issues; practice during the therapy session, assisting each partner in clarifying communication and expression of feelings. Seek a commitment from each partner to begin to work on changing specific behaviors on her/his list and on the list of the partner for her/him. Objective Increase the frequency and quality of communication with the partner. Target Date: 2023-08-05 Frequency: Biweekly  Progress: 40  Modality: individual  Related Interventions Validate the client's disappointment and disillusionment with the current relationship; assist the client with expressing her expectations and hopes regarding the relationship. Objective Express thoughts and feelings regarding the relationship in a direct manner. Target Date: 2023-08-05 Frequency: Biweekly  Progress: 55 Modality: individual  Related Interventions Use behavioral techniques (e.g., education, modeling, role-playing, corrective feedback, positive reinforcement) to teach the couple problem-solving and conflict-resolution skills, including defining the problem constructively and specifically, brainstorming options, compromise, choosing options, implementing a plan, and evaluating the results. Objective Both partners agree to a "time out" signal that either partner may give to stop interaction that may escalate into abuse. Target Date: 2023-08-05 Frequency: Biweekly  Progress: 60 implementing, following through 40 Modality: individual  Objective Increase time spent in enjoyable contact with the partner. Target Date: 2023-08-05 Frequency: Biweekly  Progress: 45 Modality: individual  6. Eliminate depressive and anxiety symptoms associated with trying to balance multiple roles. Objective Describe role and responsibilities associated with work and family and related thoughts, feelings, and behaviors. Target Date: 2023-08-05 Frequency: Biweekly  Progress: 30 Modality: individual  Related Interventions Explore with the client her multiple roles and responsibilities associated with work and family; clarify related thoughts and feelings. Objective Clarify values and priorities. Target Date: 2023-08-05 Frequency: Biweekly  Progress: 65 Modality: individual  Related Interventions Assign the client to read about progressive muscle relaxation and other calming strategies in relevant books or treatment manuals (e.g., Progressive Relaxation  Training by Twana First; Mastery of Your Anxiety and Optometrist by Lucita Ferrara). Refer the client to initiate conjoint therapy with partner. Objective Implement assertiveness skills and limit setting. Target Date: 2023-08-05 Frequency: Biweekly  Progress: 55-60 Modality: individual  Related Interventions Use behavioral techniques (i.e., education, modeling, role-playing, corrective feedback, positive reinforcement) to teach communication skills, including assertive communication, offering positive feedback, active listening, making positive requests of others for behavior change, and giving negative feedback in an honest and respectful manner. Objective Learn and implement stress management and relaxation techniques to reduce fatigue, anxiety, and depressive symptoms. Target Date: 2023-08-05 Frequency: Biweekly  Progress:  70 Modality: individual  Related Interventions Explore, along with the client, strategies for making her life more manageable and less stressful (e.g., waking up before the children to exercise or have a cup of tea, pack the children's lunches and backpacks the night before). Objective Communicate needs with partner regarding multiple role obligations. Target Date: 2023-08-05 Frequency: Biweekly  Progress: 30 Modality: individual  Related Interventions Help the client to develop a realistic schedule that outlines the responsibilities of her partner and family members; help the client enlist the commitment of all individuals to the schedule. Objective Learn and implement problem-solving and conflict-resolution skills. Target Date: 2023-08-05 Frequency: Biweekly  Progress: 50 Modality: individual  Related Interventions Assign the client to conduct family meetings regularly (e.g., possibly biweekly or monthly) with children (ages 35 and up) and partner to delegate household responsibilities. Objective Commit to a healthy eating,  sleeping, and exercise routine. Target Date: 2023-08-05 Frequency: Biweekly  Progress: 55 Modality: individual  Objective Generate a list of self-care activities and make a commitment to regularly participate in such activities. Target Date: 2023-08-05 Frequency: Biweekly  Progress: 75 Modality: individual  7. Enhance ability to handle effectively life stressors. 8. Increase awareness of own role in relationship conflicts. 9. Increase overall sense of well-being via reduction/elimination of anxiety. 10. Maintain a balance between the multiple demands of motherhood, work, and other life roles and responsibilities. 11. Resolve issues involving low self-esteem or low self-efficacy that contribute to anxiety. Objective Identify precipitating events, thoughts, feelings, and reactions that are believed to contribute to anxiety. Target Date: 2023-08-05 Frequency: Biweekly  Progress: 50 Modality: individual  Related Interventions Educate the client on the relaxing, calming, and balancing benefits of practices such as yoga, meditation, and prayer; provide suggestions on how to get started (e.g., book, video, local gym class). Objective Implement self-care strategies that serve to augment the positive effects of other interventions. Target Date: 2023-08-05 Frequency: Biweekly  Progress: 75 Modality: individual  Related Interventions Elicit the client's specific patterns of thought that contribute to anxious states; aid client in understanding the connection between the identified maladaptive thoughts and anxious feelings. Objective Learn and practice methods of reducing anxiety in a variety of situations. Target Date: 2023-08-05 Frequency: Biweekly  Progress: 75 Modality: individual  Related Interventions Educate the client on thought-stopping techniques and positive reframing in order to preempt anxious reactions. Encourage the use of positive self-talk as a replacement to some of the automatic,  negative self-talk being utilized currently. Objective Acknowledge underlying irrational or illogical thought patterns that contribute to anxiety. Target Date: 2023-08-05 Frequency: Biweekly  Progress: 50 Modality: individual  Related Interventions Aid the client in seeing the connection between a focus on helping others or improving society and a reduction in anxiety (i.e., dwelling on maladaptive thoughts). Objective Begin to refer to an internal standard of performance, morals, achievement, appearance, and so on, rather than sociocultural, parental, spousal, or otherwise externally imposed judgments. Target Date: 2023-08-05 Frequency: Biweekly  Progress: 55 Modality: individual   Diagnosis:Adjustment disorder with mixed anxiety and depressed mood  Plan:  -tracking thoughts related to desire to remain in marriage and why/why not -meet again on Thursday, July 31, 2023 at 12pm in-person

## 2023-07-31 ENCOUNTER — Ambulatory Visit: Payer: 59 | Admitting: Professional

## 2023-07-31 ENCOUNTER — Encounter: Payer: Self-pay | Admitting: Professional

## 2023-07-31 DIAGNOSIS — F4323 Adjustment disorder with mixed anxiety and depressed mood: Secondary | ICD-10-CM

## 2023-08-28 ENCOUNTER — Encounter: Payer: Self-pay | Admitting: Professional

## 2023-08-28 ENCOUNTER — Ambulatory Visit: Payer: 59 | Admitting: Professional

## 2023-08-28 DIAGNOSIS — F4323 Adjustment disorder with mixed anxiety and depressed mood: Secondary | ICD-10-CM | POA: Diagnosis not present
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
## 2023-08-28 NOTE — Addendum Note (Signed)
 Addended by: Irven Coe on: 08/28/2023 04:33 PM   Modules accepted: Level of Service

## 2023-09-19 ENCOUNTER — Ambulatory Visit: Payer: 59 | Admitting: Professional

## 2023-10-19 ENCOUNTER — Other Ambulatory Visit: Payer: Self-pay | Admitting: Family Medicine

## 2024-01-01 ENCOUNTER — Encounter: Payer: Self-pay | Admitting: Family Medicine
